# Patient Record
Sex: Male | Born: 1950
Health system: Southern US, Community
[De-identification: ages and names within clinical notes are randomized; demographics above are authoritative.]

## PROBLEM LIST (undated history)

## (undated) DIAGNOSIS — K219 Gastro-esophageal reflux disease without esophagitis: Secondary | ICD-10-CM

## (undated) DIAGNOSIS — I10 Essential (primary) hypertension: Secondary | ICD-10-CM

## (undated) HISTORY — PX: JOINT REPLACEMENT: SHX530

---

## 1991-06-24 HISTORY — PX: CERVICAL FUSION: SHX112

## 2000-09-03 ENCOUNTER — Encounter: Admission: RE | Admit: 2000-09-03 | Discharge: 2000-09-03 | Payer: Self-pay | Admitting: Family Medicine

## 2000-09-03 ENCOUNTER — Encounter: Payer: Self-pay | Admitting: Family Medicine

## 2002-11-04 ENCOUNTER — Ambulatory Visit (HOSPITAL_COMMUNITY): Admission: RE | Admit: 2002-11-04 | Discharge: 2002-11-04 | Payer: Self-pay | Admitting: Gastroenterology

## 2014-04-19 ENCOUNTER — Ambulatory Visit
Admission: RE | Admit: 2014-04-19 | Discharge: 2014-04-19 | Disposition: A | Discharge status: Home / Self Care | Payer: BC Managed Care – PPO | Source: Ambulatory Visit | Attending: Cardiology | Admitting: Cardiology

## 2014-04-19 ENCOUNTER — Other Ambulatory Visit: Payer: Self-pay | Admitting: Cardiology

## 2014-04-19 DIAGNOSIS — R0602 Shortness of breath: Secondary | ICD-10-CM

## 2017-07-02 DIAGNOSIS — G4700 Insomnia, unspecified: Secondary | ICD-10-CM | POA: Diagnosis not present

## 2017-07-02 DIAGNOSIS — Z125 Encounter for screening for malignant neoplasm of prostate: Secondary | ICD-10-CM | POA: Diagnosis not present

## 2017-07-02 DIAGNOSIS — Z0000 Encounter for general adult medical examination without abnormal findings: Secondary | ICD-10-CM | POA: Diagnosis not present

## 2017-07-02 DIAGNOSIS — Z23 Encounter for immunization: Secondary | ICD-10-CM | POA: Diagnosis not present

## 2017-07-02 DIAGNOSIS — R7303 Prediabetes: Secondary | ICD-10-CM | POA: Diagnosis not present

## 2017-07-02 DIAGNOSIS — I10 Essential (primary) hypertension: Secondary | ICD-10-CM | POA: Diagnosis not present

## 2017-07-02 DIAGNOSIS — E785 Hyperlipidemia, unspecified: Secondary | ICD-10-CM | POA: Diagnosis not present

## 2018-03-24 DIAGNOSIS — Z23 Encounter for immunization: Secondary | ICD-10-CM | POA: Diagnosis not present

## 2018-07-06 DIAGNOSIS — R7303 Prediabetes: Secondary | ICD-10-CM | POA: Diagnosis not present

## 2018-07-06 DIAGNOSIS — E785 Hyperlipidemia, unspecified: Secondary | ICD-10-CM | POA: Diagnosis not present

## 2018-07-06 DIAGNOSIS — Z1159 Encounter for screening for other viral diseases: Secondary | ICD-10-CM | POA: Diagnosis not present

## 2018-07-06 DIAGNOSIS — M5416 Radiculopathy, lumbar region: Secondary | ICD-10-CM | POA: Diagnosis not present

## 2018-07-06 DIAGNOSIS — Z23 Encounter for immunization: Secondary | ICD-10-CM | POA: Diagnosis not present

## 2018-07-06 DIAGNOSIS — Z125 Encounter for screening for malignant neoplasm of prostate: Secondary | ICD-10-CM | POA: Diagnosis not present

## 2018-07-06 DIAGNOSIS — Z0000 Encounter for general adult medical examination without abnormal findings: Secondary | ICD-10-CM | POA: Diagnosis not present

## 2018-07-06 DIAGNOSIS — I10 Essential (primary) hypertension: Secondary | ICD-10-CM | POA: Diagnosis not present

## 2018-07-06 DIAGNOSIS — Z86010 Personal history of colonic polyps: Secondary | ICD-10-CM | POA: Diagnosis not present

## 2018-07-06 DIAGNOSIS — G4700 Insomnia, unspecified: Secondary | ICD-10-CM | POA: Diagnosis not present

## 2018-07-07 DIAGNOSIS — R7303 Prediabetes: Secondary | ICD-10-CM | POA: Diagnosis not present

## 2018-07-07 DIAGNOSIS — M5416 Radiculopathy, lumbar region: Secondary | ICD-10-CM | POA: Diagnosis not present

## 2018-07-07 DIAGNOSIS — Z86010 Personal history of colonic polyps: Secondary | ICD-10-CM | POA: Diagnosis not present

## 2018-07-07 DIAGNOSIS — I10 Essential (primary) hypertension: Secondary | ICD-10-CM | POA: Diagnosis not present

## 2018-07-07 DIAGNOSIS — Z125 Encounter for screening for malignant neoplasm of prostate: Secondary | ICD-10-CM | POA: Diagnosis not present

## 2018-07-07 DIAGNOSIS — E785 Hyperlipidemia, unspecified: Secondary | ICD-10-CM | POA: Diagnosis not present

## 2018-07-07 DIAGNOSIS — Z1159 Encounter for screening for other viral diseases: Secondary | ICD-10-CM | POA: Diagnosis not present

## 2018-07-07 DIAGNOSIS — G4700 Insomnia, unspecified: Secondary | ICD-10-CM | POA: Diagnosis not present

## 2018-07-12 DIAGNOSIS — M545 Low back pain: Secondary | ICD-10-CM | POA: Diagnosis not present

## 2018-07-19 DIAGNOSIS — M545 Low back pain: Secondary | ICD-10-CM | POA: Diagnosis not present

## 2018-07-19 DIAGNOSIS — M2560 Stiffness of unspecified joint, not elsewhere classified: Secondary | ICD-10-CM | POA: Diagnosis not present

## 2018-07-19 DIAGNOSIS — M25551 Pain in right hip: Secondary | ICD-10-CM | POA: Diagnosis not present

## 2018-07-19 DIAGNOSIS — M5417 Radiculopathy, lumbosacral region: Secondary | ICD-10-CM | POA: Diagnosis not present

## 2018-07-21 DIAGNOSIS — M2560 Stiffness of unspecified joint, not elsewhere classified: Secondary | ICD-10-CM | POA: Diagnosis not present

## 2018-07-21 DIAGNOSIS — M5417 Radiculopathy, lumbosacral region: Secondary | ICD-10-CM | POA: Diagnosis not present

## 2018-07-21 DIAGNOSIS — M545 Low back pain: Secondary | ICD-10-CM | POA: Diagnosis not present

## 2018-07-21 DIAGNOSIS — M25551 Pain in right hip: Secondary | ICD-10-CM | POA: Diagnosis not present

## 2018-07-23 DIAGNOSIS — M5417 Radiculopathy, lumbosacral region: Secondary | ICD-10-CM | POA: Diagnosis not present

## 2018-07-23 DIAGNOSIS — M545 Low back pain: Secondary | ICD-10-CM | POA: Diagnosis not present

## 2018-07-23 DIAGNOSIS — M2560 Stiffness of unspecified joint, not elsewhere classified: Secondary | ICD-10-CM | POA: Diagnosis not present

## 2018-07-23 DIAGNOSIS — M25551 Pain in right hip: Secondary | ICD-10-CM | POA: Diagnosis not present

## 2018-07-26 DIAGNOSIS — M2560 Stiffness of unspecified joint, not elsewhere classified: Secondary | ICD-10-CM | POA: Diagnosis not present

## 2018-07-26 DIAGNOSIS — M5417 Radiculopathy, lumbosacral region: Secondary | ICD-10-CM | POA: Diagnosis not present

## 2018-07-26 DIAGNOSIS — M545 Low back pain: Secondary | ICD-10-CM | POA: Diagnosis not present

## 2018-07-26 DIAGNOSIS — M25551 Pain in right hip: Secondary | ICD-10-CM | POA: Diagnosis not present

## 2018-07-28 DIAGNOSIS — M5417 Radiculopathy, lumbosacral region: Secondary | ICD-10-CM | POA: Diagnosis not present

## 2018-07-28 DIAGNOSIS — M545 Low back pain: Secondary | ICD-10-CM | POA: Diagnosis not present

## 2018-07-28 DIAGNOSIS — M25551 Pain in right hip: Secondary | ICD-10-CM | POA: Diagnosis not present

## 2018-07-28 DIAGNOSIS — M2560 Stiffness of unspecified joint, not elsewhere classified: Secondary | ICD-10-CM | POA: Diagnosis not present

## 2018-07-30 DIAGNOSIS — M545 Low back pain: Secondary | ICD-10-CM | POA: Diagnosis not present

## 2018-07-30 DIAGNOSIS — M5417 Radiculopathy, lumbosacral region: Secondary | ICD-10-CM | POA: Diagnosis not present

## 2018-07-30 DIAGNOSIS — M25551 Pain in right hip: Secondary | ICD-10-CM | POA: Diagnosis not present

## 2018-07-30 DIAGNOSIS — M2560 Stiffness of unspecified joint, not elsewhere classified: Secondary | ICD-10-CM | POA: Diagnosis not present

## 2018-08-02 DIAGNOSIS — M2560 Stiffness of unspecified joint, not elsewhere classified: Secondary | ICD-10-CM | POA: Diagnosis not present

## 2018-08-02 DIAGNOSIS — M545 Low back pain: Secondary | ICD-10-CM | POA: Diagnosis not present

## 2018-08-02 DIAGNOSIS — M25551 Pain in right hip: Secondary | ICD-10-CM | POA: Diagnosis not present

## 2018-08-02 DIAGNOSIS — M5417 Radiculopathy, lumbosacral region: Secondary | ICD-10-CM | POA: Diagnosis not present

## 2018-08-04 DIAGNOSIS — M2560 Stiffness of unspecified joint, not elsewhere classified: Secondary | ICD-10-CM | POA: Diagnosis not present

## 2018-08-04 DIAGNOSIS — M5417 Radiculopathy, lumbosacral region: Secondary | ICD-10-CM | POA: Diagnosis not present

## 2018-08-04 DIAGNOSIS — M545 Low back pain: Secondary | ICD-10-CM | POA: Diagnosis not present

## 2018-08-04 DIAGNOSIS — M25551 Pain in right hip: Secondary | ICD-10-CM | POA: Diagnosis not present

## 2018-08-06 DIAGNOSIS — M25551 Pain in right hip: Secondary | ICD-10-CM | POA: Diagnosis not present

## 2018-08-06 DIAGNOSIS — M2560 Stiffness of unspecified joint, not elsewhere classified: Secondary | ICD-10-CM | POA: Diagnosis not present

## 2018-08-06 DIAGNOSIS — M5417 Radiculopathy, lumbosacral region: Secondary | ICD-10-CM | POA: Diagnosis not present

## 2018-08-06 DIAGNOSIS — M545 Low back pain: Secondary | ICD-10-CM | POA: Diagnosis not present

## 2018-08-09 DIAGNOSIS — M545 Low back pain: Secondary | ICD-10-CM | POA: Diagnosis not present

## 2018-08-11 DIAGNOSIS — M5417 Radiculopathy, lumbosacral region: Secondary | ICD-10-CM | POA: Diagnosis not present

## 2018-08-11 DIAGNOSIS — M545 Low back pain: Secondary | ICD-10-CM | POA: Diagnosis not present

## 2018-08-11 DIAGNOSIS — M2560 Stiffness of unspecified joint, not elsewhere classified: Secondary | ICD-10-CM | POA: Diagnosis not present

## 2018-08-11 DIAGNOSIS — M25551 Pain in right hip: Secondary | ICD-10-CM | POA: Diagnosis not present

## 2018-08-13 DIAGNOSIS — M5417 Radiculopathy, lumbosacral region: Secondary | ICD-10-CM | POA: Diagnosis not present

## 2018-08-13 DIAGNOSIS — M2560 Stiffness of unspecified joint, not elsewhere classified: Secondary | ICD-10-CM | POA: Diagnosis not present

## 2018-08-13 DIAGNOSIS — M545 Low back pain: Secondary | ICD-10-CM | POA: Diagnosis not present

## 2018-08-13 DIAGNOSIS — M25551 Pain in right hip: Secondary | ICD-10-CM | POA: Diagnosis not present

## 2018-08-17 DIAGNOSIS — M5417 Radiculopathy, lumbosacral region: Secondary | ICD-10-CM | POA: Diagnosis not present

## 2018-08-17 DIAGNOSIS — M25551 Pain in right hip: Secondary | ICD-10-CM | POA: Diagnosis not present

## 2018-08-17 DIAGNOSIS — M545 Low back pain: Secondary | ICD-10-CM | POA: Diagnosis not present

## 2018-08-17 DIAGNOSIS — M2560 Stiffness of unspecified joint, not elsewhere classified: Secondary | ICD-10-CM | POA: Diagnosis not present

## 2018-08-19 DIAGNOSIS — M2560 Stiffness of unspecified joint, not elsewhere classified: Secondary | ICD-10-CM | POA: Diagnosis not present

## 2018-08-19 DIAGNOSIS — M545 Low back pain: Secondary | ICD-10-CM | POA: Diagnosis not present

## 2018-08-19 DIAGNOSIS — M5417 Radiculopathy, lumbosacral region: Secondary | ICD-10-CM | POA: Diagnosis not present

## 2018-08-19 DIAGNOSIS — M25551 Pain in right hip: Secondary | ICD-10-CM | POA: Diagnosis not present

## 2019-03-29 DIAGNOSIS — Z23 Encounter for immunization: Secondary | ICD-10-CM | POA: Diagnosis not present

## 2019-05-25 DIAGNOSIS — M545 Low back pain: Secondary | ICD-10-CM | POA: Diagnosis not present

## 2019-05-25 DIAGNOSIS — M1612 Unilateral primary osteoarthritis, left hip: Secondary | ICD-10-CM | POA: Diagnosis not present

## 2019-06-22 DIAGNOSIS — M25552 Pain in left hip: Secondary | ICD-10-CM | POA: Diagnosis not present

## 2019-06-24 HISTORY — PX: TOTAL HIP ARTHROPLASTY: SHX124

## 2019-07-01 DIAGNOSIS — M1612 Unilateral primary osteoarthritis, left hip: Secondary | ICD-10-CM | POA: Diagnosis not present

## 2019-07-01 DIAGNOSIS — Z01818 Encounter for other preprocedural examination: Secondary | ICD-10-CM | POA: Diagnosis not present

## 2019-07-01 DIAGNOSIS — M25552 Pain in left hip: Secondary | ICD-10-CM | POA: Diagnosis not present

## 2019-07-01 DIAGNOSIS — Z130 Encounter for screening for diseases of the blood and blood-forming organs and certain disorders involving the immune mechanism: Secondary | ICD-10-CM | POA: Diagnosis not present

## 2019-07-01 DIAGNOSIS — R791 Abnormal coagulation profile: Secondary | ICD-10-CM | POA: Diagnosis not present

## 2019-07-06 DIAGNOSIS — D72829 Elevated white blood cell count, unspecified: Secondary | ICD-10-CM | POA: Diagnosis not present

## 2019-07-08 DIAGNOSIS — Z86010 Personal history of colonic polyps: Secondary | ICD-10-CM | POA: Diagnosis not present

## 2019-07-08 DIAGNOSIS — G4700 Insomnia, unspecified: Secondary | ICD-10-CM | POA: Diagnosis not present

## 2019-07-08 DIAGNOSIS — Z125 Encounter for screening for malignant neoplasm of prostate: Secondary | ICD-10-CM | POA: Diagnosis not present

## 2019-07-08 DIAGNOSIS — R7303 Prediabetes: Secondary | ICD-10-CM | POA: Diagnosis not present

## 2019-07-08 DIAGNOSIS — Z0000 Encounter for general adult medical examination without abnormal findings: Secondary | ICD-10-CM | POA: Diagnosis not present

## 2019-07-08 DIAGNOSIS — I10 Essential (primary) hypertension: Secondary | ICD-10-CM | POA: Diagnosis not present

## 2019-07-08 DIAGNOSIS — E785 Hyperlipidemia, unspecified: Secondary | ICD-10-CM | POA: Diagnosis not present

## 2019-07-21 DIAGNOSIS — M1612 Unilateral primary osteoarthritis, left hip: Secondary | ICD-10-CM | POA: Diagnosis not present

## 2019-07-23 DIAGNOSIS — Z471 Aftercare following joint replacement surgery: Secondary | ICD-10-CM | POA: Diagnosis not present

## 2019-07-23 DIAGNOSIS — Z7982 Long term (current) use of aspirin: Secondary | ICD-10-CM | POA: Diagnosis not present

## 2019-07-23 DIAGNOSIS — I10 Essential (primary) hypertension: Secondary | ICD-10-CM | POA: Diagnosis not present

## 2019-07-23 DIAGNOSIS — Z9181 History of falling: Secondary | ICD-10-CM | POA: Diagnosis not present

## 2019-07-23 DIAGNOSIS — Z96642 Presence of left artificial hip joint: Secondary | ICD-10-CM | POA: Diagnosis not present

## 2019-07-25 DIAGNOSIS — Z96642 Presence of left artificial hip joint: Secondary | ICD-10-CM | POA: Diagnosis not present

## 2019-07-25 DIAGNOSIS — Z471 Aftercare following joint replacement surgery: Secondary | ICD-10-CM | POA: Diagnosis not present

## 2019-07-25 DIAGNOSIS — I10 Essential (primary) hypertension: Secondary | ICD-10-CM | POA: Diagnosis not present

## 2019-07-25 DIAGNOSIS — Z9181 History of falling: Secondary | ICD-10-CM | POA: Diagnosis not present

## 2019-07-25 DIAGNOSIS — Z7982 Long term (current) use of aspirin: Secondary | ICD-10-CM | POA: Diagnosis not present

## 2019-07-26 DIAGNOSIS — Z96642 Presence of left artificial hip joint: Secondary | ICD-10-CM | POA: Diagnosis not present

## 2019-07-26 DIAGNOSIS — I10 Essential (primary) hypertension: Secondary | ICD-10-CM | POA: Diagnosis not present

## 2019-07-26 DIAGNOSIS — Z471 Aftercare following joint replacement surgery: Secondary | ICD-10-CM | POA: Diagnosis not present

## 2019-07-26 DIAGNOSIS — Z9181 History of falling: Secondary | ICD-10-CM | POA: Diagnosis not present

## 2019-07-26 DIAGNOSIS — Z7982 Long term (current) use of aspirin: Secondary | ICD-10-CM | POA: Diagnosis not present

## 2019-07-27 DIAGNOSIS — I10 Essential (primary) hypertension: Secondary | ICD-10-CM | POA: Diagnosis not present

## 2019-07-27 DIAGNOSIS — Z96642 Presence of left artificial hip joint: Secondary | ICD-10-CM | POA: Diagnosis not present

## 2019-07-27 DIAGNOSIS — Z471 Aftercare following joint replacement surgery: Secondary | ICD-10-CM | POA: Diagnosis not present

## 2019-07-27 DIAGNOSIS — Z9181 History of falling: Secondary | ICD-10-CM | POA: Diagnosis not present

## 2019-07-27 DIAGNOSIS — Z7982 Long term (current) use of aspirin: Secondary | ICD-10-CM | POA: Diagnosis not present

## 2019-07-28 DIAGNOSIS — M25552 Pain in left hip: Secondary | ICD-10-CM | POA: Diagnosis not present

## 2019-07-28 DIAGNOSIS — M6281 Muscle weakness (generalized): Secondary | ICD-10-CM | POA: Diagnosis not present

## 2019-07-28 DIAGNOSIS — M25452 Effusion, left hip: Secondary | ICD-10-CM | POA: Diagnosis not present

## 2019-07-28 DIAGNOSIS — R262 Difficulty in walking, not elsewhere classified: Secondary | ICD-10-CM | POA: Diagnosis not present

## 2019-07-28 DIAGNOSIS — M79652 Pain in left thigh: Secondary | ICD-10-CM | POA: Diagnosis not present

## 2019-07-28 DIAGNOSIS — Z96642 Presence of left artificial hip joint: Secondary | ICD-10-CM | POA: Diagnosis not present

## 2019-08-02 DIAGNOSIS — M25552 Pain in left hip: Secondary | ICD-10-CM | POA: Diagnosis not present

## 2019-08-02 DIAGNOSIS — R262 Difficulty in walking, not elsewhere classified: Secondary | ICD-10-CM | POA: Diagnosis not present

## 2019-08-02 DIAGNOSIS — M25452 Effusion, left hip: Secondary | ICD-10-CM | POA: Diagnosis not present

## 2019-08-02 DIAGNOSIS — Z96642 Presence of left artificial hip joint: Secondary | ICD-10-CM | POA: Diagnosis not present

## 2019-08-02 DIAGNOSIS — M6281 Muscle weakness (generalized): Secondary | ICD-10-CM | POA: Diagnosis not present

## 2019-08-02 DIAGNOSIS — M79652 Pain in left thigh: Secondary | ICD-10-CM | POA: Diagnosis not present

## 2019-08-03 DIAGNOSIS — M1612 Unilateral primary osteoarthritis, left hip: Secondary | ICD-10-CM | POA: Diagnosis not present

## 2019-08-04 DIAGNOSIS — M25452 Effusion, left hip: Secondary | ICD-10-CM | POA: Diagnosis not present

## 2019-08-04 DIAGNOSIS — M6281 Muscle weakness (generalized): Secondary | ICD-10-CM | POA: Diagnosis not present

## 2019-08-04 DIAGNOSIS — Z96642 Presence of left artificial hip joint: Secondary | ICD-10-CM | POA: Diagnosis not present

## 2019-08-04 DIAGNOSIS — R262 Difficulty in walking, not elsewhere classified: Secondary | ICD-10-CM | POA: Diagnosis not present

## 2019-08-04 DIAGNOSIS — M25552 Pain in left hip: Secondary | ICD-10-CM | POA: Diagnosis not present

## 2019-08-04 DIAGNOSIS — M79652 Pain in left thigh: Secondary | ICD-10-CM | POA: Diagnosis not present

## 2019-08-09 DIAGNOSIS — Z96642 Presence of left artificial hip joint: Secondary | ICD-10-CM | POA: Diagnosis not present

## 2019-08-09 DIAGNOSIS — M25552 Pain in left hip: Secondary | ICD-10-CM | POA: Diagnosis not present

## 2019-08-09 DIAGNOSIS — M6281 Muscle weakness (generalized): Secondary | ICD-10-CM | POA: Diagnosis not present

## 2019-08-09 DIAGNOSIS — M79652 Pain in left thigh: Secondary | ICD-10-CM | POA: Diagnosis not present

## 2019-08-09 DIAGNOSIS — R262 Difficulty in walking, not elsewhere classified: Secondary | ICD-10-CM | POA: Diagnosis not present

## 2019-08-09 DIAGNOSIS — M25452 Effusion, left hip: Secondary | ICD-10-CM | POA: Diagnosis not present

## 2019-08-12 DIAGNOSIS — R262 Difficulty in walking, not elsewhere classified: Secondary | ICD-10-CM | POA: Diagnosis not present

## 2019-08-12 DIAGNOSIS — M6281 Muscle weakness (generalized): Secondary | ICD-10-CM | POA: Diagnosis not present

## 2019-08-12 DIAGNOSIS — Z96642 Presence of left artificial hip joint: Secondary | ICD-10-CM | POA: Diagnosis not present

## 2019-08-12 DIAGNOSIS — M79652 Pain in left thigh: Secondary | ICD-10-CM | POA: Diagnosis not present

## 2019-08-12 DIAGNOSIS — M25552 Pain in left hip: Secondary | ICD-10-CM | POA: Diagnosis not present

## 2019-08-12 DIAGNOSIS — M25452 Effusion, left hip: Secondary | ICD-10-CM | POA: Diagnosis not present

## 2019-08-16 DIAGNOSIS — M79652 Pain in left thigh: Secondary | ICD-10-CM | POA: Diagnosis not present

## 2019-08-16 DIAGNOSIS — M6281 Muscle weakness (generalized): Secondary | ICD-10-CM | POA: Diagnosis not present

## 2019-08-16 DIAGNOSIS — M25452 Effusion, left hip: Secondary | ICD-10-CM | POA: Diagnosis not present

## 2019-08-16 DIAGNOSIS — R262 Difficulty in walking, not elsewhere classified: Secondary | ICD-10-CM | POA: Diagnosis not present

## 2019-08-16 DIAGNOSIS — Z96642 Presence of left artificial hip joint: Secondary | ICD-10-CM | POA: Diagnosis not present

## 2019-08-16 DIAGNOSIS — M25552 Pain in left hip: Secondary | ICD-10-CM | POA: Diagnosis not present

## 2019-08-18 DIAGNOSIS — M25452 Effusion, left hip: Secondary | ICD-10-CM | POA: Diagnosis not present

## 2019-08-18 DIAGNOSIS — M6281 Muscle weakness (generalized): Secondary | ICD-10-CM | POA: Diagnosis not present

## 2019-08-18 DIAGNOSIS — Z96642 Presence of left artificial hip joint: Secondary | ICD-10-CM | POA: Diagnosis not present

## 2019-08-18 DIAGNOSIS — M79652 Pain in left thigh: Secondary | ICD-10-CM | POA: Diagnosis not present

## 2019-08-18 DIAGNOSIS — M25552 Pain in left hip: Secondary | ICD-10-CM | POA: Diagnosis not present

## 2019-08-18 DIAGNOSIS — R262 Difficulty in walking, not elsewhere classified: Secondary | ICD-10-CM | POA: Diagnosis not present

## 2019-08-22 ENCOUNTER — Ambulatory Visit: Payer: Medicare Other | Attending: Internal Medicine

## 2019-08-22 DIAGNOSIS — Z23 Encounter for immunization: Secondary | ICD-10-CM

## 2019-08-22 DIAGNOSIS — Z471 Aftercare following joint replacement surgery: Secondary | ICD-10-CM | POA: Diagnosis not present

## 2019-08-22 NOTE — Progress Notes (Signed)
   Covid-19 Vaccination Clinic  Name:  Colton Norris    MRN: [id number] DOB: 25-Sep-1950  08/22/2019  [name] was observed post Covid-19 immunization for 15 minutes without incidence. He was provided with Vaccine Information Sheet and instruction to access the V-Safe system.   [name] was instructed to call 911 with any severe reactions post vaccine: Mar[place] Kitchen Difficulty breathing  . Swelling of your face and throat  . A fast heartbeat  . A bad rash all over your body  . Dizziness and weakness    Immunizations Administered    Name Date Dose VIS Date Route   Pfizer COVID-19 Vaccine 08/22/2019 10:31 AM 0.3 mL 06/03/2019 Intramuscular   Manufacturer: [place]   Lot: [id number]   [place]: [id number]

## 2019-08-23 DIAGNOSIS — M25452 Effusion, left hip: Secondary | ICD-10-CM | POA: Diagnosis not present

## 2019-08-23 DIAGNOSIS — M6281 Muscle weakness (generalized): Secondary | ICD-10-CM | POA: Diagnosis not present

## 2019-08-23 DIAGNOSIS — Z96642 Presence of left artificial hip joint: Secondary | ICD-10-CM | POA: Diagnosis not present

## 2019-08-23 DIAGNOSIS — M25552 Pain in left hip: Secondary | ICD-10-CM | POA: Diagnosis not present

## 2019-08-23 DIAGNOSIS — R262 Difficulty in walking, not elsewhere classified: Secondary | ICD-10-CM | POA: Diagnosis not present

## 2019-08-23 DIAGNOSIS — M79652 Pain in left thigh: Secondary | ICD-10-CM | POA: Diagnosis not present

## 2019-08-25 DIAGNOSIS — R7303 Prediabetes: Secondary | ICD-10-CM | POA: Diagnosis not present

## 2019-08-25 DIAGNOSIS — M25552 Pain in left hip: Secondary | ICD-10-CM | POA: Diagnosis not present

## 2019-08-25 DIAGNOSIS — Z125 Encounter for screening for malignant neoplasm of prostate: Secondary | ICD-10-CM | POA: Diagnosis not present

## 2019-08-25 DIAGNOSIS — Z0000 Encounter for general adult medical examination without abnormal findings: Secondary | ICD-10-CM | POA: Diagnosis not present

## 2019-08-25 DIAGNOSIS — M6281 Muscle weakness (generalized): Secondary | ICD-10-CM | POA: Diagnosis not present

## 2019-08-25 DIAGNOSIS — R262 Difficulty in walking, not elsewhere classified: Secondary | ICD-10-CM | POA: Diagnosis not present

## 2019-08-25 DIAGNOSIS — G4700 Insomnia, unspecified: Secondary | ICD-10-CM | POA: Diagnosis not present

## 2019-08-25 DIAGNOSIS — E785 Hyperlipidemia, unspecified: Secondary | ICD-10-CM | POA: Diagnosis not present

## 2019-08-25 DIAGNOSIS — I10 Essential (primary) hypertension: Secondary | ICD-10-CM | POA: Diagnosis not present

## 2019-08-25 DIAGNOSIS — Z96642 Presence of left artificial hip joint: Secondary | ICD-10-CM | POA: Diagnosis not present

## 2019-08-25 DIAGNOSIS — Z86010 Personal history of colonic polyps: Secondary | ICD-10-CM | POA: Diagnosis not present

## 2019-08-25 DIAGNOSIS — M25452 Effusion, left hip: Secondary | ICD-10-CM | POA: Diagnosis not present

## 2019-08-25 DIAGNOSIS — M79652 Pain in left thigh: Secondary | ICD-10-CM | POA: Diagnosis not present

## 2019-08-30 DIAGNOSIS — M25552 Pain in left hip: Secondary | ICD-10-CM | POA: Diagnosis not present

## 2019-08-30 DIAGNOSIS — M79652 Pain in left thigh: Secondary | ICD-10-CM | POA: Diagnosis not present

## 2019-08-30 DIAGNOSIS — Z96642 Presence of left artificial hip joint: Secondary | ICD-10-CM | POA: Diagnosis not present

## 2019-08-30 DIAGNOSIS — M25452 Effusion, left hip: Secondary | ICD-10-CM | POA: Diagnosis not present

## 2019-08-30 DIAGNOSIS — R262 Difficulty in walking, not elsewhere classified: Secondary | ICD-10-CM | POA: Diagnosis not present

## 2019-08-30 DIAGNOSIS — M6281 Muscle weakness (generalized): Secondary | ICD-10-CM | POA: Diagnosis not present

## 2019-08-31 DIAGNOSIS — M1612 Unilateral primary osteoarthritis, left hip: Secondary | ICD-10-CM | POA: Diagnosis not present

## 2019-09-01 DIAGNOSIS — M25452 Effusion, left hip: Secondary | ICD-10-CM | POA: Diagnosis not present

## 2019-09-01 DIAGNOSIS — M79652 Pain in left thigh: Secondary | ICD-10-CM | POA: Diagnosis not present

## 2019-09-01 DIAGNOSIS — M25552 Pain in left hip: Secondary | ICD-10-CM | POA: Diagnosis not present

## 2019-09-01 DIAGNOSIS — M6281 Muscle weakness (generalized): Secondary | ICD-10-CM | POA: Diagnosis not present

## 2019-09-01 DIAGNOSIS — Z96642 Presence of left artificial hip joint: Secondary | ICD-10-CM | POA: Diagnosis not present

## 2019-09-01 DIAGNOSIS — R262 Difficulty in walking, not elsewhere classified: Secondary | ICD-10-CM | POA: Diagnosis not present

## 2019-09-07 DIAGNOSIS — M6281 Muscle weakness (generalized): Secondary | ICD-10-CM | POA: Diagnosis not present

## 2019-09-07 DIAGNOSIS — Z96642 Presence of left artificial hip joint: Secondary | ICD-10-CM | POA: Diagnosis not present

## 2019-09-07 DIAGNOSIS — M25552 Pain in left hip: Secondary | ICD-10-CM | POA: Diagnosis not present

## 2019-09-07 DIAGNOSIS — R262 Difficulty in walking, not elsewhere classified: Secondary | ICD-10-CM | POA: Diagnosis not present

## 2019-09-07 DIAGNOSIS — M79652 Pain in left thigh: Secondary | ICD-10-CM | POA: Diagnosis not present

## 2019-09-07 DIAGNOSIS — M25452 Effusion, left hip: Secondary | ICD-10-CM | POA: Diagnosis not present

## 2019-09-20 ENCOUNTER — Ambulatory Visit: Payer: Medicare Other | Attending: Internal Medicine

## 2019-09-20 DIAGNOSIS — Z23 Encounter for immunization: Secondary | ICD-10-CM

## 2019-09-20 NOTE — Progress Notes (Signed)
   Covid-19 Vaccination Clinic  Name:  Colton Norris    MRN: [id number] DOB: [birth year]  09/20/2019  Mr. Colton Norris was observed post Covid-19 immunization for 15 minutes without incident. He was provided with Vaccine Information Sheet and instruction to access the V-Safe system.   Colton Norris was instructed to call 911 with any severe reactions post vaccine: Marland Kitchen Difficulty breathing  . Swelling of face and throat  . A fast heartbeat  . A bad rash all over body  . Dizziness and weakness   Immunizations Administered    Name Date Dose VIS Date Route   Pfizer COVID-19 Vaccine 09/20/2019 10:58 AM 0.3 mL 06/03/2019 Intramuscular   Manufacturer: [place]   Lot: [id number]   [place]: [id number]

## 2019-09-28 DIAGNOSIS — Z96642 Presence of left artificial hip joint: Secondary | ICD-10-CM | POA: Diagnosis not present

## 2019-12-22 HISTORY — PX: TOTAL HIP ARTHROPLASTY: SHX124

## 2019-12-24 DIAGNOSIS — M25551 Pain in right hip: Secondary | ICD-10-CM | POA: Diagnosis not present

## 2019-12-28 DIAGNOSIS — M25551 Pain in right hip: Secondary | ICD-10-CM | POA: Diagnosis not present

## 2019-12-28 DIAGNOSIS — R791 Abnormal coagulation profile: Secondary | ICD-10-CM | POA: Diagnosis not present

## 2019-12-28 DIAGNOSIS — M1611 Unilateral primary osteoarthritis, right hip: Secondary | ICD-10-CM | POA: Diagnosis not present

## 2019-12-28 DIAGNOSIS — Z01812 Encounter for preprocedural laboratory examination: Secondary | ICD-10-CM | POA: Diagnosis not present

## 2020-01-05 DIAGNOSIS — M1611 Unilateral primary osteoarthritis, right hip: Secondary | ICD-10-CM | POA: Diagnosis not present

## 2020-01-09 DIAGNOSIS — M6281 Muscle weakness (generalized): Secondary | ICD-10-CM | POA: Diagnosis not present

## 2020-01-09 DIAGNOSIS — M25551 Pain in right hip: Secondary | ICD-10-CM | POA: Diagnosis not present

## 2020-01-09 DIAGNOSIS — M25651 Stiffness of right hip, not elsewhere classified: Secondary | ICD-10-CM | POA: Diagnosis not present

## 2020-01-09 DIAGNOSIS — R262 Difficulty in walking, not elsewhere classified: Secondary | ICD-10-CM | POA: Diagnosis not present

## 2020-01-09 DIAGNOSIS — Z96641 Presence of right artificial hip joint: Secondary | ICD-10-CM | POA: Diagnosis not present

## 2020-01-11 DIAGNOSIS — Z96641 Presence of right artificial hip joint: Secondary | ICD-10-CM | POA: Diagnosis not present

## 2020-01-11 DIAGNOSIS — M6281 Muscle weakness (generalized): Secondary | ICD-10-CM | POA: Diagnosis not present

## 2020-01-11 DIAGNOSIS — M25551 Pain in right hip: Secondary | ICD-10-CM | POA: Diagnosis not present

## 2020-01-11 DIAGNOSIS — R262 Difficulty in walking, not elsewhere classified: Secondary | ICD-10-CM | POA: Diagnosis not present

## 2020-01-11 DIAGNOSIS — M25651 Stiffness of right hip, not elsewhere classified: Secondary | ICD-10-CM | POA: Diagnosis not present

## 2020-01-16 DIAGNOSIS — M25651 Stiffness of right hip, not elsewhere classified: Secondary | ICD-10-CM | POA: Diagnosis not present

## 2020-01-16 DIAGNOSIS — M25551 Pain in right hip: Secondary | ICD-10-CM | POA: Diagnosis not present

## 2020-01-16 DIAGNOSIS — Z96641 Presence of right artificial hip joint: Secondary | ICD-10-CM | POA: Diagnosis not present

## 2020-01-16 DIAGNOSIS — M6281 Muscle weakness (generalized): Secondary | ICD-10-CM | POA: Diagnosis not present

## 2020-01-16 DIAGNOSIS — R262 Difficulty in walking, not elsewhere classified: Secondary | ICD-10-CM | POA: Diagnosis not present

## 2020-01-18 DIAGNOSIS — M1611 Unilateral primary osteoarthritis, right hip: Secondary | ICD-10-CM | POA: Diagnosis not present

## 2020-01-19 DIAGNOSIS — Z96641 Presence of right artificial hip joint: Secondary | ICD-10-CM | POA: Diagnosis not present

## 2020-01-19 DIAGNOSIS — M25551 Pain in right hip: Secondary | ICD-10-CM | POA: Diagnosis not present

## 2020-01-19 DIAGNOSIS — M25651 Stiffness of right hip, not elsewhere classified: Secondary | ICD-10-CM | POA: Diagnosis not present

## 2020-01-19 DIAGNOSIS — R262 Difficulty in walking, not elsewhere classified: Secondary | ICD-10-CM | POA: Diagnosis not present

## 2020-01-19 DIAGNOSIS — M6281 Muscle weakness (generalized): Secondary | ICD-10-CM | POA: Diagnosis not present

## 2020-01-23 DIAGNOSIS — M25551 Pain in right hip: Secondary | ICD-10-CM | POA: Diagnosis not present

## 2020-01-23 DIAGNOSIS — Z96641 Presence of right artificial hip joint: Secondary | ICD-10-CM | POA: Diagnosis not present

## 2020-01-23 DIAGNOSIS — M6281 Muscle weakness (generalized): Secondary | ICD-10-CM | POA: Diagnosis not present

## 2020-01-23 DIAGNOSIS — R262 Difficulty in walking, not elsewhere classified: Secondary | ICD-10-CM | POA: Diagnosis not present

## 2020-01-23 DIAGNOSIS — M25651 Stiffness of right hip, not elsewhere classified: Secondary | ICD-10-CM | POA: Diagnosis not present

## 2020-01-25 DIAGNOSIS — M25651 Stiffness of right hip, not elsewhere classified: Secondary | ICD-10-CM | POA: Diagnosis not present

## 2020-01-25 DIAGNOSIS — M6281 Muscle weakness (generalized): Secondary | ICD-10-CM | POA: Diagnosis not present

## 2020-01-25 DIAGNOSIS — M25551 Pain in right hip: Secondary | ICD-10-CM | POA: Diagnosis not present

## 2020-01-25 DIAGNOSIS — Z96641 Presence of right artificial hip joint: Secondary | ICD-10-CM | POA: Diagnosis not present

## 2020-01-25 DIAGNOSIS — R262 Difficulty in walking, not elsewhere classified: Secondary | ICD-10-CM | POA: Diagnosis not present

## 2020-01-30 DIAGNOSIS — R262 Difficulty in walking, not elsewhere classified: Secondary | ICD-10-CM | POA: Diagnosis not present

## 2020-01-30 DIAGNOSIS — M6281 Muscle weakness (generalized): Secondary | ICD-10-CM | POA: Diagnosis not present

## 2020-01-30 DIAGNOSIS — M25551 Pain in right hip: Secondary | ICD-10-CM | POA: Diagnosis not present

## 2020-01-30 DIAGNOSIS — Z96641 Presence of right artificial hip joint: Secondary | ICD-10-CM | POA: Diagnosis not present

## 2020-01-30 DIAGNOSIS — M25651 Stiffness of right hip, not elsewhere classified: Secondary | ICD-10-CM | POA: Diagnosis not present

## 2020-02-03 DIAGNOSIS — Z96641 Presence of right artificial hip joint: Secondary | ICD-10-CM | POA: Diagnosis not present

## 2020-02-03 DIAGNOSIS — M25651 Stiffness of right hip, not elsewhere classified: Secondary | ICD-10-CM | POA: Diagnosis not present

## 2020-02-03 DIAGNOSIS — M6281 Muscle weakness (generalized): Secondary | ICD-10-CM | POA: Diagnosis not present

## 2020-02-03 DIAGNOSIS — R262 Difficulty in walking, not elsewhere classified: Secondary | ICD-10-CM | POA: Diagnosis not present

## 2020-02-03 DIAGNOSIS — M25551 Pain in right hip: Secondary | ICD-10-CM | POA: Diagnosis not present

## 2020-02-06 DIAGNOSIS — M25551 Pain in right hip: Secondary | ICD-10-CM | POA: Diagnosis not present

## 2020-02-06 DIAGNOSIS — M25651 Stiffness of right hip, not elsewhere classified: Secondary | ICD-10-CM | POA: Diagnosis not present

## 2020-02-06 DIAGNOSIS — Z96641 Presence of right artificial hip joint: Secondary | ICD-10-CM | POA: Diagnosis not present

## 2020-02-06 DIAGNOSIS — R262 Difficulty in walking, not elsewhere classified: Secondary | ICD-10-CM | POA: Diagnosis not present

## 2020-02-06 DIAGNOSIS — M6281 Muscle weakness (generalized): Secondary | ICD-10-CM | POA: Diagnosis not present

## 2020-02-08 DIAGNOSIS — M25651 Stiffness of right hip, not elsewhere classified: Secondary | ICD-10-CM | POA: Diagnosis not present

## 2020-02-08 DIAGNOSIS — R262 Difficulty in walking, not elsewhere classified: Secondary | ICD-10-CM | POA: Diagnosis not present

## 2020-02-08 DIAGNOSIS — M25551 Pain in right hip: Secondary | ICD-10-CM | POA: Diagnosis not present

## 2020-02-08 DIAGNOSIS — M6281 Muscle weakness (generalized): Secondary | ICD-10-CM | POA: Diagnosis not present

## 2020-02-08 DIAGNOSIS — Z96641 Presence of right artificial hip joint: Secondary | ICD-10-CM | POA: Diagnosis not present

## 2020-02-13 DIAGNOSIS — M25551 Pain in right hip: Secondary | ICD-10-CM | POA: Diagnosis not present

## 2020-02-13 DIAGNOSIS — Z96641 Presence of right artificial hip joint: Secondary | ICD-10-CM | POA: Diagnosis not present

## 2020-02-13 DIAGNOSIS — M25651 Stiffness of right hip, not elsewhere classified: Secondary | ICD-10-CM | POA: Diagnosis not present

## 2020-02-13 DIAGNOSIS — M6281 Muscle weakness (generalized): Secondary | ICD-10-CM | POA: Diagnosis not present

## 2020-02-13 DIAGNOSIS — R262 Difficulty in walking, not elsewhere classified: Secondary | ICD-10-CM | POA: Diagnosis not present

## 2020-02-20 DIAGNOSIS — M25551 Pain in right hip: Secondary | ICD-10-CM | POA: Diagnosis not present

## 2020-02-20 DIAGNOSIS — M25651 Stiffness of right hip, not elsewhere classified: Secondary | ICD-10-CM | POA: Diagnosis not present

## 2020-02-20 DIAGNOSIS — R262 Difficulty in walking, not elsewhere classified: Secondary | ICD-10-CM | POA: Diagnosis not present

## 2020-02-20 DIAGNOSIS — Z96641 Presence of right artificial hip joint: Secondary | ICD-10-CM | POA: Diagnosis not present

## 2020-02-20 DIAGNOSIS — M6281 Muscle weakness (generalized): Secondary | ICD-10-CM | POA: Diagnosis not present

## 2020-02-22 DIAGNOSIS — R262 Difficulty in walking, not elsewhere classified: Secondary | ICD-10-CM | POA: Diagnosis not present

## 2020-02-22 DIAGNOSIS — M6281 Muscle weakness (generalized): Secondary | ICD-10-CM | POA: Diagnosis not present

## 2020-02-22 DIAGNOSIS — M25551 Pain in right hip: Secondary | ICD-10-CM | POA: Diagnosis not present

## 2020-02-22 DIAGNOSIS — Z96641 Presence of right artificial hip joint: Secondary | ICD-10-CM | POA: Diagnosis not present

## 2020-02-22 DIAGNOSIS — M25651 Stiffness of right hip, not elsewhere classified: Secondary | ICD-10-CM | POA: Diagnosis not present

## 2020-03-01 DIAGNOSIS — R7303 Prediabetes: Secondary | ICD-10-CM | POA: Diagnosis not present

## 2020-03-01 DIAGNOSIS — I10 Essential (primary) hypertension: Secondary | ICD-10-CM | POA: Diagnosis not present

## 2020-03-01 DIAGNOSIS — G4700 Insomnia, unspecified: Secondary | ICD-10-CM | POA: Diagnosis not present

## 2020-03-01 DIAGNOSIS — Z86010 Personal history of colonic polyps: Secondary | ICD-10-CM | POA: Diagnosis not present

## 2020-03-01 DIAGNOSIS — E785 Hyperlipidemia, unspecified: Secondary | ICD-10-CM | POA: Diagnosis not present

## 2020-03-06 DIAGNOSIS — Z86010 Personal history of colonic polyps: Secondary | ICD-10-CM | POA: Diagnosis not present

## 2020-03-06 DIAGNOSIS — I10 Essential (primary) hypertension: Secondary | ICD-10-CM | POA: Diagnosis not present

## 2020-03-06 DIAGNOSIS — G4700 Insomnia, unspecified: Secondary | ICD-10-CM | POA: Diagnosis not present

## 2020-03-06 DIAGNOSIS — E785 Hyperlipidemia, unspecified: Secondary | ICD-10-CM | POA: Diagnosis not present

## 2020-03-06 DIAGNOSIS — R7303 Prediabetes: Secondary | ICD-10-CM | POA: Diagnosis not present

## 2020-04-04 DIAGNOSIS — M1611 Unilateral primary osteoarthritis, right hip: Secondary | ICD-10-CM | POA: Diagnosis not present

## 2020-04-12 DIAGNOSIS — Z23 Encounter for immunization: Secondary | ICD-10-CM | POA: Diagnosis not present

## 2020-08-27 DIAGNOSIS — R7303 Prediabetes: Secondary | ICD-10-CM | POA: Diagnosis not present

## 2020-08-27 DIAGNOSIS — I10 Essential (primary) hypertension: Secondary | ICD-10-CM | POA: Diagnosis not present

## 2020-08-27 DIAGNOSIS — Z1211 Encounter for screening for malignant neoplasm of colon: Secondary | ICD-10-CM | POA: Diagnosis not present

## 2020-08-27 DIAGNOSIS — Z125 Encounter for screening for malignant neoplasm of prostate: Secondary | ICD-10-CM | POA: Diagnosis not present

## 2020-08-27 DIAGNOSIS — Z0000 Encounter for general adult medical examination without abnormal findings: Secondary | ICD-10-CM | POA: Diagnosis not present

## 2020-08-27 DIAGNOSIS — Z86010 Personal history of colonic polyps: Secondary | ICD-10-CM | POA: Diagnosis not present

## 2020-08-27 DIAGNOSIS — E785 Hyperlipidemia, unspecified: Secondary | ICD-10-CM | POA: Diagnosis not present

## 2020-08-27 DIAGNOSIS — G4700 Insomnia, unspecified: Secondary | ICD-10-CM | POA: Diagnosis not present

## 2020-08-28 DIAGNOSIS — Z23 Encounter for immunization: Secondary | ICD-10-CM | POA: Diagnosis not present

## 2020-11-05 DIAGNOSIS — M5432 Sciatica, left side: Secondary | ICD-10-CM | POA: Diagnosis not present

## 2020-12-03 DIAGNOSIS — M5432 Sciatica, left side: Secondary | ICD-10-CM | POA: Diagnosis not present

## 2020-12-11 DIAGNOSIS — M545 Low back pain, unspecified: Secondary | ICD-10-CM | POA: Diagnosis not present

## 2020-12-11 DIAGNOSIS — M5416 Radiculopathy, lumbar region: Secondary | ICD-10-CM | POA: Diagnosis not present

## 2020-12-17 DIAGNOSIS — M5126 Other intervertebral disc displacement, lumbar region: Secondary | ICD-10-CM | POA: Diagnosis not present

## 2020-12-17 DIAGNOSIS — M5416 Radiculopathy, lumbar region: Secondary | ICD-10-CM | POA: Diagnosis not present

## 2020-12-17 DIAGNOSIS — M48061 Spinal stenosis, lumbar region without neurogenic claudication: Secondary | ICD-10-CM | POA: Diagnosis not present

## 2020-12-19 DIAGNOSIS — M5126 Other intervertebral disc displacement, lumbar region: Secondary | ICD-10-CM | POA: Diagnosis not present

## 2020-12-19 DIAGNOSIS — M48061 Spinal stenosis, lumbar region without neurogenic claudication: Secondary | ICD-10-CM | POA: Diagnosis not present

## 2020-12-19 DIAGNOSIS — M5416 Radiculopathy, lumbar region: Secondary | ICD-10-CM | POA: Diagnosis not present

## 2021-01-14 DIAGNOSIS — M5416 Radiculopathy, lumbar region: Secondary | ICD-10-CM | POA: Diagnosis not present

## 2021-02-05 DIAGNOSIS — M48061 Spinal stenosis, lumbar region without neurogenic claudication: Secondary | ICD-10-CM | POA: Diagnosis not present

## 2021-02-05 DIAGNOSIS — M5416 Radiculopathy, lumbar region: Secondary | ICD-10-CM | POA: Diagnosis not present

## 2021-02-11 DIAGNOSIS — M545 Low back pain, unspecified: Secondary | ICD-10-CM | POA: Diagnosis not present

## 2021-02-15 DIAGNOSIS — M545 Low back pain, unspecified: Secondary | ICD-10-CM | POA: Diagnosis not present

## 2021-02-19 DIAGNOSIS — M545 Low back pain, unspecified: Secondary | ICD-10-CM | POA: Diagnosis not present

## 2021-02-22 DIAGNOSIS — M1711 Unilateral primary osteoarthritis, right knee: Secondary | ICD-10-CM | POA: Diagnosis not present

## 2021-02-22 DIAGNOSIS — M25661 Stiffness of right knee, not elsewhere classified: Secondary | ICD-10-CM | POA: Diagnosis not present

## 2021-02-22 DIAGNOSIS — M6281 Muscle weakness (generalized): Secondary | ICD-10-CM | POA: Diagnosis not present

## 2021-02-22 DIAGNOSIS — M25561 Pain in right knee: Secondary | ICD-10-CM | POA: Diagnosis not present

## 2021-02-26 DIAGNOSIS — M545 Low back pain, unspecified: Secondary | ICD-10-CM | POA: Diagnosis not present

## 2021-02-28 DIAGNOSIS — M545 Low back pain, unspecified: Secondary | ICD-10-CM | POA: Diagnosis not present

## 2021-03-04 DIAGNOSIS — Z23 Encounter for immunization: Secondary | ICD-10-CM | POA: Diagnosis not present

## 2021-03-04 DIAGNOSIS — G4700 Insomnia, unspecified: Secondary | ICD-10-CM | POA: Diagnosis not present

## 2021-03-04 DIAGNOSIS — E785 Hyperlipidemia, unspecified: Secondary | ICD-10-CM | POA: Diagnosis not present

## 2021-03-04 DIAGNOSIS — R7303 Prediabetes: Secondary | ICD-10-CM | POA: Diagnosis not present

## 2021-03-04 DIAGNOSIS — I10 Essential (primary) hypertension: Secondary | ICD-10-CM | POA: Diagnosis not present

## 2021-03-13 DIAGNOSIS — M5416 Radiculopathy, lumbar region: Secondary | ICD-10-CM | POA: Diagnosis not present

## 2021-03-19 DIAGNOSIS — M5416 Radiculopathy, lumbar region: Secondary | ICD-10-CM | POA: Diagnosis not present

## 2021-03-19 DIAGNOSIS — M48061 Spinal stenosis, lumbar region without neurogenic claudication: Secondary | ICD-10-CM | POA: Diagnosis not present

## 2021-03-19 DIAGNOSIS — M545 Low back pain, unspecified: Secondary | ICD-10-CM | POA: Diagnosis not present

## 2021-03-26 ENCOUNTER — Other Ambulatory Visit: Payer: Self-pay | Admitting: Orthopedic Surgery

## 2021-04-08 NOTE — Pre-Procedure Instructions (Signed)
Surgical Instructions    Your procedure is scheduled on Thursday, October 27th.  Report to [facility] Main Entrance "A" at 5:30 A.M., then check in with the Admitting office.  Call this number if you have problems the morning of surgery:  [phone]   If you have any questions prior to your surgery date call [phone]: Open Monday-Friday 8am-4pm    Remember:  Do not eat after midnight the night before your surgery  You may drink clear liquids until 4:30 a.m. the morning of your surgery.   Clear liquids allowed are: Water, Non-Citrus Juices (without pulp), Carbonated Beverages, Clear Tea, Black Coffee Only, and Gatorade.   Enhanced Recovery after Surgery for Orthopedics Enhanced Recovery after Surgery is a protocol used to improve the stress on your body and your recovery after surgery.  Patient Instructions  The day of surgery (if you do NOT have diabetes):  Drink ONE (1) Pre-Surgery Clear Ensure by 4:30 am the morning of surgery   This drink was given to you during your hospital  pre-op appointment visit. Nothing else to drink after completing the  Pre-Surgery Clear Ensure.         If you have questions, please contact your surgeon's office.     Take these medicines the morning of surgery with A SIP OF WATER  gabapentin (NEURONTIN)  omeprazole (PRILOSEC) simvastatin (ZOCOR)  Follow your surgeon's instructions on when to stop Aspirin.  If no instructions were given by your surgeon then you will need to call the office to get those instructions.    As of today, STOP taking any Aspirin (unless otherwise instructed by your surgeon) Aleve, Naproxen, Ibuprofen, Motrin, Advil, Goody's, BC's, all herbal medications, fish oil, and all vitamins.                     Do NOT Smoke (Tobacco/Vaping) or drink Alcohol 24 hours prior to your procedure.  If you use a CPAP at night, you may bring all equipment for your overnight stay.   Contacts, glasses, piercing's, hearing aid's,  dentures or partials may not be worn into surgery, please bring cases for these belongings.    For patients admitted to the hospital, discharge time will be determined by your treatment team.   Patients discharged the day of surgery will not be allowed to drive home, and someone needs to stay with them for 24 hours.  NO VISITORS WILL BE ALLOWED IN PRE-OP WHERE PATIENTS GET READY FOR SURGERY.  ONLY 1 SUPPORT PERSON MAY BE PRESENT IN THE WAITING ROOM WHILE YOU ARE IN SURGERY.  IF YOU ARE TO BE ADMITTED, ONCE YOU ARE IN YOUR ROOM YOU WILL BE ALLOWED TWO (2) VISITORS.  Minor children may have two parents present. Special consideration for safety and communication needs will be reviewed on a case by case basis.   Special instructions:   [place]- Preparing For Surgery  Before surgery, you can play an important role. Because skin is not sterile, your skin needs to be as free of germs as possible. You can reduce the number of germs on your skin by washing with CHG (chlorahexidine gluconate) Soap before surgery.  CHG is an antiseptic cleaner which kills germs and bonds with the skin to continue killing germs even after washing.    Oral Hygiene is also important to reduce your risk of infection.  Remember - BRUSH YOUR TEETH THE MORNING OF SURGERY WITH YOUR REGULAR TOOTHPASTE  Please do not use if you have an  allergy to CHG or antibacterial soaps. If your skin becomes reddened/irritated stop using the CHG.  Do not shave (including legs and underarms) for at least 48 hours prior to first CHG shower. It is OK to shave your face.  Please follow these instructions carefully.   Shower the NIGHT BEFORE SURGERY and the MORNING OF SURGERY  If you chose to wash your hair, wash your hair first as usual with your normal shampoo.  After you shampoo, rinse your hair and body thoroughly to remove the shampoo.  Use CHG Soap as you would any other liquid soap. You can apply CHG directly to the skin and wash  gently with a scrungie or a clean washcloth.   Apply the CHG Soap to your body ONLY FROM THE NECK DOWN.  Do not use on open wounds or open sores. Avoid contact with your eyes, ears, mouth and genitals (private parts). Wash Face and genitals (private parts)  with your normal soap.   Wash thoroughly, paying special attention to the area where your surgery will be performed.  Thoroughly rinse your body with warm water from the neck down.  DO NOT shower/wash with your normal soap after using and rinsing off the CHG Soap.  Pat yourself dry with a CLEAN TOWEL.  Wear CLEAN PAJAMAS to bed the night before surgery  Place CLEAN SHEETS on your bed the night before your surgery  DO NOT SLEEP WITH PETS.   Day of Surgery: Shower with CHG soap. Do not wear jewelry. Do not wear lotions, powders, colognes, or deodorant. Do not shave 48 hours prior to surgery.  Men may shave face and neck. Do not bring valuables to the hospital. [facility] is not responsible for any belongings or valuables. Wear Clean/Comfortable clothing the morning of surgery Remember to brush your teeth WITH YOUR REGULAR TOOTHPASTE.   Please read over the following fact sheets that you were given.   3 days prior to your procedure or After your COVID test   You are not required to quarantine however you are required to wear a well-fitting mask when you are out and around people not in your household. If your mask becomes wet or soiled, replace with a new one.   Wash your hands often with soap and water for 20 seconds or clean your hands with an alcohol-based hand sanitizer that contains at least 60% alcohol.   Do not share personal items.   Notify your provider:  o if you are in close contact with someone who has COVID  o or if you develop a fever of 100.4 or greater, sneezing, cough, sore throat, shortness of breath or body aches.

## 2021-04-09 ENCOUNTER — Encounter (HOSPITAL_COMMUNITY): Payer: Self-pay

## 2021-04-09 ENCOUNTER — Other Ambulatory Visit: Payer: Self-pay

## 2021-04-09 ENCOUNTER — Encounter (HOSPITAL_COMMUNITY)
Admission: RE | Admit: 2021-04-09 | Discharge: 2021-04-09 | Disposition: A | Discharge status: Home / Self Care | Payer: Medicare Other | Source: Ambulatory Visit | Attending: Orthopedic Surgery | Admitting: Orthopedic Surgery

## 2021-04-09 DIAGNOSIS — Z01818 Encounter for other preprocedural examination: Secondary | ICD-10-CM | POA: Diagnosis not present

## 2021-04-09 DIAGNOSIS — M4800 Spinal stenosis, site unspecified: Secondary | ICD-10-CM | POA: Insufficient documentation

## 2021-04-09 HISTORY — DX: Gastro-esophageal reflux disease without esophagitis: K21.9

## 2021-04-09 HISTORY — DX: Essential (primary) hypertension: I10

## 2021-04-09 LAB — PROTIME-INR
INR: 0.9 (ref 0.8–1.2)
Prothrombin Time: 12.3 seconds (ref 11.4–15.2)

## 2021-04-09 LAB — CBC WITH DIFFERENTIAL/PLATELET
Abs Immature Granulocytes: 0.03 10*3/uL (ref 0.00–0.07)
Basophils Absolute: 0 10*3/uL (ref 0.0–0.1)
Basophils Relative: 1 %
Eosinophils Absolute: 0.2 10*3/uL (ref 0.0–0.5)
Eosinophils Relative: 2 %
HCT: 46.2 % (ref 39.0–52.0)
Hemoglobin: 15.6 g/dL (ref 13.0–17.0)
Immature Granulocytes: 0 %
Lymphocytes Relative: 34 %
Lymphs Abs: 2.8 10*3/uL (ref 0.7–4.0)
MCH: 33.4 pg (ref 26.0–34.0)
MCHC: 33.8 g/dL (ref 30.0–36.0)
MCV: 98.9 fL (ref 80.0–100.0)
Monocytes Absolute: 0.8 10*3/uL (ref 0.1–1.0)
Monocytes Relative: 10 %
Neutro Abs: 4.5 10*3/uL (ref 1.7–7.7)
Neutrophils Relative %: 53 %
Platelets: 281 10*3/uL (ref 150–400)
RBC: 4.67 MIL/uL (ref 4.22–5.81)
RDW: 11.2 % — ABNORMAL LOW (ref 11.5–15.5)
WBC: 8.3 10*3/uL (ref 4.0–10.5)
nRBC: 0 % (ref 0.0–0.2)

## 2021-04-09 LAB — URINALYSIS, ROUTINE W REFLEX MICROSCOPIC
Bacteria, UA: NONE SEEN
Bilirubin Urine: NEGATIVE
Glucose, UA: NEGATIVE mg/dL
Hgb urine dipstick: NEGATIVE
Ketones, ur: NEGATIVE mg/dL
Nitrite: NEGATIVE
Protein, ur: NEGATIVE mg/dL
Specific Gravity, Urine: 1.013 (ref 1.005–1.030)
pH: 6 (ref 5.0–8.0)

## 2021-04-09 LAB — COMPREHENSIVE METABOLIC PANEL
ALT: 31 U/L (ref 0–44)
AST: 26 U/L (ref 15–41)
Albumin: 4.1 g/dL (ref 3.5–5.0)
Alkaline Phosphatase: 58 U/L (ref 38–126)
Anion gap: 8 (ref 5–15)
BUN: 12 mg/dL (ref 8–23)
CO2: 26 mmol/L (ref 22–32)
Calcium: 9.6 mg/dL (ref 8.9–10.3)
Chloride: 104 mmol/L (ref 98–111)
Creatinine, Ser: 0.94 mg/dL (ref 0.61–1.24)
GFR, Estimated: >60 mL/min (ref >60)
Glucose, Bld: 113 mg/dL — ABNORMAL HIGH (ref 70–99)
Potassium: 4 mmol/L (ref 3.5–5.1)
Sodium: 138 mmol/L (ref 135–145)
Total Bilirubin: 0.7 mg/dL (ref 0.3–1.2)
Total Protein: 7.2 g/dL (ref 6.5–8.1)

## 2021-04-09 LAB — TYPE AND SCREEN
ABO/RH(D): A POS
Antibody Screen: NEGATIVE

## 2021-04-09 LAB — SURGICAL PCR SCREEN
MRSA, PCR: POSITIVE — AB
Staphylococcus aureus: POSITIVE — AB

## 2021-04-09 LAB — APTT: aPTT: 31 seconds (ref 24–36)

## 2021-04-09 NOTE — Progress Notes (Signed)
Notified Butch Penny at Dr. Laurena Bering office regarding pt being positive for MRSA/MSSA.   Jacqlyn Larsen, RN

## 2021-04-09 NOTE — Progress Notes (Signed)
PCP - Dr. Yaakov Guthrie Cardiologist - denies  Chest x-ray - n/a EKG - 04/09/21 Stress Test - 5+ years ago. Reported normal. No f/u needed. ECHO - denies Cardiac Cath - denies  Sleep Study - denies CPAP - denies  Blood Thinner Instructions: n/a Aspirin Instructions: L/D around 10/15.   ERAS Protcol -Clear liquids until 0430 DOS. PRE-SURGERY Ensure or G2- (1) pre-surgical ensure.   COVID TEST- Scheduled for Monday, October 24th at [facility] in [place].   Anesthesia review: No  Patient denies shortness of breath, fever, cough and chest pain at PAT appointment   All instructions explained to the patient, with a verbal understanding of the material. Patient agrees to go over the instructions while at home for a better understanding. Patient also instructed to self quarantine after being tested for COVID-19. The opportunity to ask questions was provided.

## 2021-04-15 ENCOUNTER — Other Ambulatory Visit (HOSPITAL_COMMUNITY)
Admission: RE | Admit: 2021-04-15 | Discharge: 2021-04-15 | Disposition: A | Discharge status: Home / Self Care | Payer: Medicare Other | Source: Ambulatory Visit | Attending: Orthopedic Surgery | Admitting: Orthopedic Surgery

## 2021-04-15 DIAGNOSIS — Z96643 Presence of artificial hip joint, bilateral: Secondary | ICD-10-CM | POA: Diagnosis not present

## 2021-04-15 DIAGNOSIS — M62838 Other muscle spasm: Secondary | ICD-10-CM | POA: Diagnosis present

## 2021-04-15 DIAGNOSIS — Z01818 Encounter for other preprocedural examination: Secondary | ICD-10-CM

## 2021-04-15 DIAGNOSIS — M4316 Spondylolisthesis, lumbar region: Secondary | ICD-10-CM | POA: Diagnosis not present

## 2021-04-15 DIAGNOSIS — M2578 Osteophyte, vertebrae: Secondary | ICD-10-CM | POA: Diagnosis not present

## 2021-04-15 DIAGNOSIS — Z20822 Contact with and (suspected) exposure to covid-19: Secondary | ICD-10-CM | POA: Diagnosis not present

## 2021-04-15 DIAGNOSIS — K219 Gastro-esophageal reflux disease without esophagitis: Secondary | ICD-10-CM | POA: Diagnosis not present

## 2021-04-15 DIAGNOSIS — M4186 Other forms of scoliosis, lumbar region: Secondary | ICD-10-CM | POA: Diagnosis not present

## 2021-04-15 DIAGNOSIS — M532X6 Spinal instabilities, lumbar region: Secondary | ICD-10-CM | POA: Diagnosis not present

## 2021-04-15 DIAGNOSIS — M5416 Radiculopathy, lumbar region: Secondary | ICD-10-CM | POA: Diagnosis not present

## 2021-04-15 DIAGNOSIS — I10 Essential (primary) hypertension: Secondary | ICD-10-CM | POA: Diagnosis not present

## 2021-04-15 DIAGNOSIS — Z79899 Other long term (current) drug therapy: Secondary | ICD-10-CM | POA: Diagnosis not present

## 2021-04-15 DIAGNOSIS — M48061 Spinal stenosis, lumbar region without neurogenic claudication: Secondary | ICD-10-CM | POA: Diagnosis not present

## 2021-04-15 DIAGNOSIS — Z87891 Personal history of nicotine dependence: Secondary | ICD-10-CM | POA: Diagnosis not present

## 2021-04-15 DIAGNOSIS — M4807 Spinal stenosis, lumbosacral region: Secondary | ICD-10-CM | POA: Diagnosis not present

## 2021-04-15 DIAGNOSIS — Z01812 Encounter for preprocedural laboratory examination: Secondary | ICD-10-CM | POA: Insufficient documentation

## 2021-04-15 DIAGNOSIS — M4326 Fusion of spine, lumbar region: Secondary | ICD-10-CM | POA: Diagnosis not present

## 2021-04-15 DIAGNOSIS — Z981 Arthrodesis status: Secondary | ICD-10-CM | POA: Diagnosis not present

## 2021-04-15 DIAGNOSIS — M4156 Other secondary scoliosis, lumbar region: Secondary | ICD-10-CM | POA: Diagnosis not present

## 2021-04-15 DIAGNOSIS — Z7982 Long term (current) use of aspirin: Secondary | ICD-10-CM | POA: Diagnosis not present

## 2021-04-15 DIAGNOSIS — F1090 Alcohol use, unspecified, uncomplicated: Secondary | ICD-10-CM | POA: Diagnosis not present

## 2021-04-15 LAB — SARS CORONAVIRUS 2 (TAT 6-24 HRS): SARS Coronavirus 2: NEGATIVE

## 2021-04-18 ENCOUNTER — Inpatient Hospital Stay (HOSPITAL_COMMUNITY): Payer: Medicare Other | Admitting: Certified Registered Nurse Anesthetist

## 2021-04-18 ENCOUNTER — Inpatient Hospital Stay (HOSPITAL_COMMUNITY): Payer: Medicare Other

## 2021-04-18 ENCOUNTER — Inpatient Hospital Stay (HOSPITAL_COMMUNITY)
Admission: RE | Admit: 2021-04-18 | Discharge: 2021-04-20 | DRG: 455 | Disposition: A | Discharge status: Home / Self Care | Payer: Medicare Other | Source: Ambulatory Visit | Attending: Orthopedic Surgery | Admitting: Orthopedic Surgery

## 2021-04-18 ENCOUNTER — Encounter (HOSPITAL_COMMUNITY)
Admission: RE | Disposition: A | Discharge status: Home / Self Care | Payer: Self-pay | Source: Ambulatory Visit | Attending: Orthopedic Surgery

## 2021-04-18 ENCOUNTER — Encounter (HOSPITAL_COMMUNITY): Payer: Self-pay | Admitting: Orthopedic Surgery

## 2021-04-18 DIAGNOSIS — Z79899 Other long term (current) drug therapy: Secondary | ICD-10-CM | POA: Diagnosis not present

## 2021-04-18 DIAGNOSIS — M2578 Osteophyte, vertebrae: Secondary | ICD-10-CM | POA: Diagnosis not present

## 2021-04-18 DIAGNOSIS — Z20822 Contact with and (suspected) exposure to covid-19: Secondary | ICD-10-CM | POA: Diagnosis present

## 2021-04-18 DIAGNOSIS — M4800 Spinal stenosis, site unspecified: Secondary | ICD-10-CM | POA: Diagnosis present

## 2021-04-18 DIAGNOSIS — F1090 Alcohol use, unspecified, uncomplicated: Secondary | ICD-10-CM | POA: Diagnosis present

## 2021-04-18 DIAGNOSIS — K219 Gastro-esophageal reflux disease without esophagitis: Secondary | ICD-10-CM | POA: Diagnosis present

## 2021-04-18 DIAGNOSIS — M4326 Fusion of spine, lumbar region: Secondary | ICD-10-CM | POA: Diagnosis not present

## 2021-04-18 DIAGNOSIS — Z7982 Long term (current) use of aspirin: Secondary | ICD-10-CM | POA: Diagnosis not present

## 2021-04-18 DIAGNOSIS — M4186 Other forms of scoliosis, lumbar region: Secondary | ICD-10-CM | POA: Diagnosis not present

## 2021-04-18 DIAGNOSIS — I10 Essential (primary) hypertension: Secondary | ICD-10-CM | POA: Diagnosis present

## 2021-04-18 DIAGNOSIS — Z96643 Presence of artificial hip joint, bilateral: Secondary | ICD-10-CM | POA: Diagnosis present

## 2021-04-18 DIAGNOSIS — M4807 Spinal stenosis, lumbosacral region: Secondary | ICD-10-CM | POA: Diagnosis present

## 2021-04-18 DIAGNOSIS — M532X6 Spinal instabilities, lumbar region: Secondary | ICD-10-CM | POA: Diagnosis not present

## 2021-04-18 DIAGNOSIS — M4316 Spondylolisthesis, lumbar region: Secondary | ICD-10-CM | POA: Diagnosis present

## 2021-04-18 DIAGNOSIS — M48061 Spinal stenosis, lumbar region without neurogenic claudication: Secondary | ICD-10-CM | POA: Diagnosis present

## 2021-04-18 DIAGNOSIS — M62838 Other muscle spasm: Secondary | ICD-10-CM | POA: Diagnosis present

## 2021-04-18 DIAGNOSIS — M4156 Other secondary scoliosis, lumbar region: Secondary | ICD-10-CM | POA: Diagnosis present

## 2021-04-18 DIAGNOSIS — M5416 Radiculopathy, lumbar region: Secondary | ICD-10-CM | POA: Diagnosis present

## 2021-04-18 DIAGNOSIS — Z419 Encounter for procedure for purposes other than remedying health state, unspecified: Secondary | ICD-10-CM

## 2021-04-18 DIAGNOSIS — Z87891 Personal history of nicotine dependence: Secondary | ICD-10-CM

## 2021-04-18 DIAGNOSIS — Z981 Arthrodesis status: Secondary | ICD-10-CM | POA: Diagnosis not present

## 2021-04-18 HISTORY — PX: TRANSFORAMINAL LUMBAR INTERBODY FUSION (TLIF) WITH PEDICLE SCREW FIXATION 4 LEVEL: SHX6144

## 2021-04-18 LAB — ABO/RH: ABO/RH(D): A POS

## 2021-04-18 SURGERY — TRANSFORAMINAL LUMBAR INTERBODY FUSION (TLIF) WITH PEDICLE SCREW FIXATION 4 LEVEL
Anesthesia: General | Laterality: Left

## 2021-04-18 MED ORDER — ADULT MULTIVITAMIN W/MINERALS CH: 1.0000 | ORAL_TABLET | Freq: Every day | ORAL | Status: DC

## 2021-04-18 MED ORDER — MIDAZOLAM HCL 5 MG/5ML IJ SOLN
INTRAMUSCULAR | Status: DC | PRN
  Administered 2021-04-18: 2 mg via INTRAVENOUS

## 2021-04-18 MED ORDER — ZOLPIDEM TARTRATE 5 MG PO TABS: 5.0000 mg | ORAL_TABLET | Freq: Every evening | ORAL | Status: DC | PRN

## 2021-04-18 MED ORDER — BUPIVACAINE LIPOSOME 1.3 % IJ SUSP
INTRAMUSCULAR | Status: AC
  Filled 2021-04-18: qty 20

## 2021-04-18 MED ORDER — VITAMIN B-12 1000 MCG PO TABS: 1000.0000 ug | ORAL_TABLET | Freq: Every day | ORAL | Status: DC

## 2021-04-18 MED ORDER — HYDROXYZINE HCL 50 MG/ML IM SOLN: 50.0000 mg | Freq: Four times a day (QID) | INTRAMUSCULAR | Status: DC | PRN

## 2021-04-18 MED ORDER — ONDANSETRON HCL 4 MG/2ML IJ SOLN: 4.0000 mg | Freq: Four times a day (QID) | INTRAMUSCULAR | Status: DC | PRN

## 2021-04-18 MED ORDER — OXYCODONE HCL 5 MG PO TABS
5.0000 mg | ORAL_TABLET | Freq: Once | ORAL | Status: AC | PRN
  Administered 2021-04-18: 5 mg via ORAL

## 2021-04-18 MED ORDER — SENNOSIDES-DOCUSATE SODIUM 8.6-50 MG PO TABS: 1.0000 | ORAL_TABLET | Freq: Every evening | ORAL | Status: DC | PRN

## 2021-04-18 MED ORDER — CHLORHEXIDINE GLUCONATE CLOTH 2 % EX PADS
6.0000 | MEDICATED_PAD | Freq: Every day | CUTANEOUS | Status: DC
  Administered 2021-04-19 – 2021-04-20 (×2): 6 via TOPICAL

## 2021-04-18 MED ORDER — ONDANSETRON HCL 4 MG PO TABS
4.0000 mg | ORAL_TABLET | Freq: Four times a day (QID) | ORAL | Status: DC | PRN
Start: 2021-04-18 — End: 2021-04-20

## 2021-04-18 MED ORDER — POTASSIUM CHLORIDE IN NACL 20-0.9 MEQ/L-% IV SOLN: INTRAVENOUS | Status: DC

## 2021-04-18 MED ORDER — METHOCARBAMOL 1000 MG/10ML IJ SOLN
500.0000 mg | Freq: Four times a day (QID) | INTRAVENOUS | Status: DC | PRN
  Filled 2021-04-18: qty 5

## 2021-04-18 MED ORDER — VASOPRESSIN 20 UNIT/ML IV SOLN
INTRAVENOUS | Status: AC
  Filled 2021-04-18: qty 1

## 2021-04-18 MED ORDER — BUPIVACAINE LIPOSOME 1.3 % IJ SUSP
INTRAMUSCULAR | Status: DC | PRN
  Administered 2021-04-18: 14 mL

## 2021-04-18 MED ORDER — PHENYLEPHRINE HCL-NACL 20-0.9 MG/250ML-% IV SOLN
INTRAVENOUS | Status: DC | PRN
  Administered 2021-04-18: 50 ug/min via INTRAVENOUS

## 2021-04-18 MED ORDER — DIPHENHYDRAMINE HCL 50 MG/ML IJ SOLN
INTRAMUSCULAR | Status: DC | PRN
  Administered 2021-04-18: 6.25 mg via INTRAVENOUS

## 2021-04-18 MED ORDER — LACTATED RINGERS IV SOLN: INTRAVENOUS | Status: DC

## 2021-04-18 MED ORDER — PROPOFOL 10 MG/ML IV BOLUS
INTRAVENOUS | Status: AC
  Filled 2021-04-18: qty 20

## 2021-04-18 MED ORDER — BUPIVACAINE-EPINEPHRINE 0.25% -1:200000 IJ SOLN
INTRAMUSCULAR | Status: DC | PRN
  Administered 2021-04-18: 9 mL
  Administered 2021-04-18: 14 mL

## 2021-04-18 MED ORDER — PHENOL 1.4 % MT LIQD: 1.0000 | OROMUCOSAL | Status: DC | PRN

## 2021-04-18 MED ORDER — THROMBIN 20000 UNITS EX SOLR
CUTANEOUS | Status: DC | PRN
  Administered 2021-04-18: 20 mL

## 2021-04-18 MED ORDER — ASCORBIC ACID 500 MG PO TABS: 500.0000 mg | ORAL_TABLET | Freq: Every day | ORAL | Status: DC

## 2021-04-18 MED ORDER — MIDAZOLAM HCL 2 MG/2ML IJ SOLN
INTRAMUSCULAR | Status: AC
  Filled 2021-04-18: qty 2

## 2021-04-18 MED ORDER — IRBESARTAN 150 MG PO TABS
150.0000 mg | ORAL_TABLET | Freq: Every day | ORAL | Status: DC
  Administered 2021-04-18 – 2021-04-19 (×2): 150 mg via ORAL
  Filled 2021-04-18 (×2): qty 1

## 2021-04-18 MED ORDER — MORPHINE SULFATE (PF) 2 MG/ML IV SOLN
2.0000 mg | INTRAVENOUS | Status: DC | PRN
  Administered 2021-04-18 – 2021-04-19 (×2): 2 mg via INTRAVENOUS
  Filled 2021-04-18 (×2): qty 1

## 2021-04-18 MED ORDER — SODIUM CHLORIDE 0.9% FLUSH
3.0000 mL | Freq: Two times a day (BID) | INTRAVENOUS | Status: DC
  Administered 2021-04-19 (×2): 3 mL via INTRAVENOUS

## 2021-04-18 MED ORDER — CEFAZOLIN SODIUM-DEXTROSE 2-4 GM/100ML-% IV SOLN
INTRAVENOUS | Status: AC
  Filled 2021-04-18: qty 100

## 2021-04-18 MED ORDER — SIMVASTATIN 20 MG PO TABS
40.0000 mg | ORAL_TABLET | Freq: Every day | ORAL | Status: DC
  Administered 2021-04-19: 40 mg via ORAL
  Filled 2021-04-18: qty 2

## 2021-04-18 MED ORDER — FENTANYL CITRATE (PF) 250 MCG/5ML IJ SOLN
INTRAMUSCULAR | Status: AC
  Filled 2021-04-18: qty 5

## 2021-04-18 MED ORDER — ONDANSETRON HCL 4 MG/2ML IJ SOLN
INTRAMUSCULAR | Status: DC | PRN
  Administered 2021-04-18: 4 mg via INTRAVENOUS

## 2021-04-18 MED ORDER — THROMBIN 20000 UNITS EX SOLR
CUTANEOUS | Status: AC
  Filled 2021-04-18: qty 20000

## 2021-04-18 MED ORDER — ASPIRIN EC 81 MG PO TBEC
81.0000 mg | DELAYED_RELEASE_TABLET | Freq: Every day | ORAL | Status: DC
  Administered 2021-04-18 – 2021-04-19 (×2): 81 mg via ORAL
  Filled 2021-04-18 (×2): qty 1

## 2021-04-18 MED ORDER — ACETAMINOPHEN 650 MG RE SUPP: 650.0000 mg | RECTAL | Status: DC | PRN

## 2021-04-18 MED ORDER — OXYCODONE HCL 5 MG PO TABS: 5.0000 mg | ORAL_TABLET | ORAL | Status: DC | PRN

## 2021-04-18 MED ORDER — ZINC GLUCONATE 50 MG PO TABS: 50.0000 mg | ORAL_TABLET | Freq: Every day | ORAL | Status: DC

## 2021-04-18 MED ORDER — ROCURONIUM BROMIDE 10 MG/ML (PF) SYRINGE
PREFILLED_SYRINGE | INTRAVENOUS | Status: DC | PRN
  Administered 2021-04-18: 50 mg via INTRAVENOUS
  Administered 2021-04-18: 40 mg via INTRAVENOUS
  Administered 2021-04-18: 60 mg via INTRAVENOUS
  Administered 2021-04-18: 50 mg via INTRAVENOUS

## 2021-04-18 MED ORDER — EPHEDRINE SULFATE-NACL 50-0.9 MG/10ML-% IV SOSY
PREFILLED_SYRINGE | INTRAVENOUS | Status: DC | PRN
  Administered 2021-04-18: 5 mg via INTRAVENOUS

## 2021-04-18 MED ORDER — FENTANYL CITRATE (PF) 250 MCG/5ML IJ SOLN
INTRAMUSCULAR | Status: DC | PRN
  Administered 2021-04-18 (×7): 50 ug via INTRAVENOUS
  Administered 2021-04-18: 150 ug via INTRAVENOUS

## 2021-04-18 MED ORDER — METHOCARBAMOL 500 MG PO TABS
500.0000 mg | ORAL_TABLET | Freq: Four times a day (QID) | ORAL | Status: DC | PRN
  Administered 2021-04-18 – 2021-04-20 (×5): 500 mg via ORAL
  Filled 2021-04-18 (×5): qty 1

## 2021-04-18 MED ORDER — KETAMINE HCL 50 MG/5ML IJ SOSY
PREFILLED_SYRINGE | INTRAMUSCULAR | Status: AC
  Filled 2021-04-18: qty 5

## 2021-04-18 MED ORDER — SODIUM CHLORIDE 0.9 % IV SOLN: INTRAVENOUS | Status: DC | PRN

## 2021-04-18 MED ORDER — 0.9 % SODIUM CHLORIDE (POUR BTL) OPTIME
TOPICAL | Status: DC | PRN
  Administered 2021-04-18 (×5): 1000 mL

## 2021-04-18 MED ORDER — FENTANYL CITRATE (PF) 100 MCG/2ML IJ SOLN
25.0000 ug | INTRAMUSCULAR | Status: DC | PRN
  Administered 2021-04-18: 50 ug via INTRAVENOUS
  Administered 2021-04-18: 25 ug via INTRAVENOUS
  Administered 2021-04-18: 50 ug via INTRAVENOUS

## 2021-04-18 MED ORDER — CEFAZOLIN SODIUM-DEXTROSE 2-4 GM/100ML-% IV SOLN: 2.0000 g | INTRAVENOUS | Status: DC

## 2021-04-18 MED ORDER — SODIUM CHLORIDE 0.9 % IV SOLN: 250.0000 mL | INTRAVENOUS | Status: DC

## 2021-04-18 MED ORDER — POVIDONE-IODINE 7.5 % EX SOLN
Freq: Once | CUTANEOUS | Status: DC
  Filled 2021-04-18: qty 118

## 2021-04-18 MED ORDER — MENTHOL 3 MG MT LOZG: 1.0000 | LOZENGE | OROMUCOSAL | Status: DC | PRN

## 2021-04-18 MED ORDER — HYDROMORPHONE HCL 1 MG/ML IJ SOLN
INTRAMUSCULAR | Status: AC
  Filled 2021-04-18: qty 0.5

## 2021-04-18 MED ORDER — KETAMINE HCL 10 MG/ML IJ SOLN
INTRAMUSCULAR | Status: DC | PRN
  Administered 2021-04-18: 50 mg via INTRAVENOUS

## 2021-04-18 MED ORDER — HYDROMORPHONE HCL 1 MG/ML IJ SOLN
INTRAMUSCULAR | Status: DC | PRN
  Administered 2021-04-18 (×4): .25 mg via INTRAVENOUS

## 2021-04-18 MED ORDER — VANCOMYCIN HCL IN DEXTROSE 1-5 GM/200ML-% IV SOLN
1000.0000 mg | INTRAVENOUS | Status: AC
  Administered 2021-04-18: 1000 mg via INTRAVENOUS
  Filled 2021-04-18: qty 200

## 2021-04-18 MED ORDER — OXYCODONE HCL 5 MG/5ML PO SOLN: 5.0000 mg | Freq: Once | ORAL | Status: AC | PRN

## 2021-04-18 MED ORDER — ALUM & MAG HYDROXIDE-SIMETH 200-200-20 MG/5ML PO SUSP: 30.0000 mL | Freq: Four times a day (QID) | ORAL | Status: DC | PRN

## 2021-04-18 MED ORDER — LIDOCAINE 2% (20 MG/ML) 5 ML SYRINGE
INTRAMUSCULAR | Status: DC | PRN
  Administered 2021-04-18: 50 mg via INTRAVENOUS

## 2021-04-18 MED ORDER — GABAPENTIN 600 MG PO TABS
600.0000 mg | ORAL_TABLET | Freq: Three times a day (TID) | ORAL | Status: DC
  Administered 2021-04-18 – 2021-04-19 (×5): 600 mg via ORAL
  Filled 2021-04-18 (×5): qty 1

## 2021-04-18 MED ORDER — FENTANYL CITRATE (PF) 100 MCG/2ML IJ SOLN
INTRAMUSCULAR | Status: AC
  Filled 2021-04-18: qty 2

## 2021-04-18 MED ORDER — METHOCARBAMOL 500 MG PO TABS: 500.0000 mg | ORAL_TABLET | Freq: Four times a day (QID) | ORAL | 2 refills | Status: DC | PRN

## 2021-04-18 MED ORDER — OXYCODONE-ACETAMINOPHEN 5-325 MG PO TABS: 1.0000 | ORAL_TABLET | ORAL | 0 refills | Status: AC | PRN

## 2021-04-18 MED ORDER — CHLORHEXIDINE GLUCONATE 0.12 % MT SOLN
15.0000 mL | Freq: Once | OROMUCOSAL | Status: AC
  Administered 2021-04-18: 15 mL via OROMUCOSAL
  Filled 2021-04-18: qty 15

## 2021-04-18 MED ORDER — OXYCODONE HCL 5 MG PO TABS
10.0000 mg | ORAL_TABLET | ORAL | Status: DC | PRN
  Administered 2021-04-18 – 2021-04-20 (×9): 10 mg via ORAL
  Filled 2021-04-18 (×9): qty 2

## 2021-04-18 MED ORDER — PHENYLEPHRINE HCL (PRESSORS) 10 MG/ML IV SOLN
INTRAVENOUS | Status: DC | PRN
  Administered 2021-04-18 (×2): 120 ug via INTRAVENOUS

## 2021-04-18 MED ORDER — MUPIROCIN 2 % EX OINT
1.0000 "application " | TOPICAL_OINTMENT | Freq: Two times a day (BID) | CUTANEOUS | Status: DC
  Administered 2021-04-18 – 2021-04-19 (×3): 1 via NASAL
  Filled 2021-04-18 (×2): qty 22

## 2021-04-18 MED ORDER — LACTATED RINGERS IV SOLN: INTRAVENOUS | Status: DC | PRN

## 2021-04-18 MED ORDER — TURMERIC 500 MG PO CAPS: 500.0000 mg | ORAL_CAPSULE | Freq: Every day | ORAL | Status: DC

## 2021-04-18 MED ORDER — OXYCODONE HCL 5 MG PO TABS
ORAL_TABLET | ORAL | Status: AC
  Filled 2021-04-18: qty 1

## 2021-04-18 MED ORDER — PANTOPRAZOLE SODIUM 40 MG PO TBEC
40.0000 mg | DELAYED_RELEASE_TABLET | Freq: Every day | ORAL | Status: DC
  Administered 2021-04-19: 40 mg via ORAL
  Filled 2021-04-18: qty 1

## 2021-04-18 MED ORDER — ORAL CARE MOUTH RINSE: 15.0000 mL | Freq: Once | OROMUCOSAL | Status: AC

## 2021-04-18 MED ORDER — VANCOMYCIN HCL IN DEXTROSE 1-5 GM/200ML-% IV SOLN
1000.0000 mg | Freq: Two times a day (BID) | INTRAVENOUS | Status: DC
  Administered 2021-04-18 – 2021-04-20 (×4): 1000 mg via INTRAVENOUS
  Filled 2021-04-18 (×4): qty 200

## 2021-04-18 MED ORDER — SODIUM CHLORIDE 0.9% FLUSH: 3.0000 mL | INTRAVENOUS | Status: DC | PRN

## 2021-04-18 MED ORDER — ONDANSETRON HCL 4 MG/2ML IJ SOLN
INTRAMUSCULAR | Status: AC
  Filled 2021-04-18: qty 2

## 2021-04-18 MED ORDER — BUPIVACAINE-EPINEPHRINE (PF) 0.25% -1:200000 IJ SOLN
INTRAMUSCULAR | Status: AC
  Filled 2021-04-18: qty 30

## 2021-04-18 MED ORDER — ACETAMINOPHEN 10 MG/ML IV SOLN
INTRAVENOUS | Status: DC | PRN
  Administered 2021-04-18: 1000 mg via INTRAVENOUS

## 2021-04-18 MED ORDER — ONDANSETRON HCL 4 MG/2ML IJ SOLN
4.0000 mg | Freq: Four times a day (QID) | INTRAMUSCULAR | Status: AC | PRN
Start: 2021-04-18 — End: 2021-04-18
  Administered 2021-04-18: 4 mg via INTRAVENOUS

## 2021-04-18 MED ORDER — ACETAMINOPHEN 325 MG PO TABS
650.0000 mg | ORAL_TABLET | ORAL | Status: DC | PRN
  Administered 2021-04-18 – 2021-04-20 (×3): 650 mg via ORAL
  Filled 2021-04-18 (×3): qty 2

## 2021-04-18 MED ORDER — PROPOFOL 10 MG/ML IV BOLUS
INTRAVENOUS | Status: DC | PRN
Start: 2021-04-18 — End: 2021-04-18
  Administered 2021-04-18: 150 mg via INTRAVENOUS

## 2021-04-18 SURGICAL SUPPLY — 98 items
BAG COUNTER SPONGE SURGICOUNT (BAG) ×2 IMPLANT
BENZOIN TINCTURE PRP APPL 2/3 (GAUZE/BANDAGES/DRESSINGS) ×2 IMPLANT
BLADE CLIPPER SURG (BLADE) IMPLANT
BUR PRESCISION 1.7 ELITE (BURR) ×2 IMPLANT
BUR ROUND FLUTED 5 RND (BURR) ×4 IMPLANT
BUR ROUND PRECISION 4.0 (BURR) IMPLANT
CAGE SABLE 10X26 6-12 8D (Cage) ×2 IMPLANT
CAGE SABLE 10X30 6-12 8D (Cage) ×2 IMPLANT
CAGE SABLE 10X30 7-14 15D (Cage) ×2 IMPLANT
CAGE SABLE 10X30 9-16 8D (Cage) ×2 IMPLANT
CARTRIDGE OIL MAESTRO DRILL (MISCELLANEOUS) ×1 IMPLANT
CNTNR URN SCR LID CUP LEK RST (MISCELLANEOUS) ×1 IMPLANT
CONT SPEC 4OZ STRL OR WHT (MISCELLANEOUS) ×1
CORD BIPOLAR FORCEPS 12FT (ELECTRODE) ×2 IMPLANT
COVER SURGICAL LIGHT HANDLE (MISCELLANEOUS) ×2 IMPLANT
DIFFUSER DRILL AIR PNEUMATIC (MISCELLANEOUS) ×2 IMPLANT
DRAIN CHANNEL 15F RND FF W/TCR (WOUND CARE) ×2 IMPLANT
DRAPE C-ARM 42X72 X-RAY (DRAPES) ×2 IMPLANT
DRAPE HALF SHEET 40X57 (DRAPES) ×12 IMPLANT
DRAPE POUCH INSTRU U-SHP 10X18 (DRAPES) ×2 IMPLANT
DRAPE SURG 17X23 STRL (DRAPES) ×8 IMPLANT
DRSG MEPILEX BORDER 4X12 (GAUZE/BANDAGES/DRESSINGS) IMPLANT
DRSG MEPILEX BORDER 4X8 (GAUZE/BANDAGES/DRESSINGS) IMPLANT
DURAPREP 26ML APPLICATOR (WOUND CARE) ×2 IMPLANT
ELECT BLADE 4.0 EZ CLEAN MEGAD (MISCELLANEOUS) ×2
ELECT CAUTERY BLADE 6.4 (BLADE) ×4 IMPLANT
ELECT REM PT RETURN 9FT ADLT (ELECTROSURGICAL) ×2
ELECTRODE BLDE 4.0 EZ CLN MEGD (MISCELLANEOUS) ×1 IMPLANT
ELECTRODE REM PT RTRN 9FT ADLT (ELECTROSURGICAL) ×1 IMPLANT
EVACUATOR SILICONE 100CC (DRAIN) ×2 IMPLANT
GAUZE 4X4 16PLY ~~LOC~~+RFID DBL (SPONGE) ×8 IMPLANT
GAUZE SPONGE 4X4 12PLY STRL (GAUZE/BANDAGES/DRESSINGS) ×2 IMPLANT
GLOVE SRG 8 PF TXTR STRL LF DI (GLOVE) ×1 IMPLANT
GLOVE SURG ENC MOIS LTX SZ6.5 (GLOVE) ×2 IMPLANT
GLOVE SURG ENC MOIS LTX SZ8 (GLOVE) ×2 IMPLANT
GLOVE SURG POLYISO LF SZ7 (GLOVE) ×2 IMPLANT
GLOVE SURG UNDER POLY LF SZ7 (GLOVE) ×2 IMPLANT
GLOVE SURG UNDER POLY LF SZ8 (GLOVE) ×1
GOWN STRL REUS W/ TWL LRG LVL3 (GOWN DISPOSABLE) ×3 IMPLANT
GOWN STRL REUS W/ TWL XL LVL3 (GOWN DISPOSABLE) ×2 IMPLANT
GOWN STRL REUS W/TWL LRG LVL3 (GOWN DISPOSABLE) ×6
GOWN STRL REUS W/TWL XL LVL3 (GOWN DISPOSABLE) ×2
IV CATH 14GX2 1/4 (CATHETERS) ×2 IMPLANT
KIT BASIN OR (CUSTOM PROCEDURE TRAY) ×2 IMPLANT
KIT POSITION SURG JACKSON T1 (MISCELLANEOUS) ×2 IMPLANT
KIT TURNOVER KIT B (KITS) ×2 IMPLANT
MARKER SKIN DUAL TIP RULER LAB (MISCELLANEOUS) ×2 IMPLANT
MILL MEDIUM DISP (BLADE) ×2 IMPLANT
MIX DBX 10CC 35% BONE (Bone Implant) ×4 IMPLANT
NEEDLE BONE MARROW 8GX6 FENEST (NEEDLE) IMPLANT
NEEDLE HYPO 25GX1X1/2 BEV (NEEDLE) ×2 IMPLANT
NEEDLE SPNL 18GX3.5 QUINCKE PK (NEEDLE) ×4 IMPLANT
NS IRRIG 1000ML POUR BTL (IV SOLUTION) ×10 IMPLANT
OIL CARTRIDGE MAESTRO DRILL (MISCELLANEOUS) ×2
PACK LAMINECTOMY ORTHO (CUSTOM PROCEDURE TRAY) ×2 IMPLANT
PACK UNIVERSAL I (CUSTOM PROCEDURE TRAY) ×2 IMPLANT
PAD ARMBOARD 7.5X6 YLW CONV (MISCELLANEOUS) ×4 IMPLANT
PATTIES SURGICAL .5 X1 (DISPOSABLE) ×2 IMPLANT
PATTIES SURGICAL .5 X3 (DISPOSABLE) ×2 IMPLANT
PATTIES SURGICAL .5X1.5 (GAUZE/BANDAGES/DRESSINGS) ×2 IMPLANT
PATTIES SURGICAL .75X.75 (GAUZE/BANDAGES/DRESSINGS) ×2 IMPLANT
PUTTY BONE DBX 5CC MIX (Putty) ×2 IMPLANT
ROD EXPEDIUM PREBENT 95MM (Rod) ×4 IMPLANT
SCREW CORTICAL VIPER 7X45MM (Screw) ×4 IMPLANT
SCREW SET SINGLE INNER (Screw) ×16 IMPLANT
SCREW VIPER CORT FIX 6.00X30 (Screw) ×8 IMPLANT
SCREW VIPER CORTICAL FIX 6X40 (Screw) ×4 IMPLANT
SPONGE INTESTINAL PEANUT (DISPOSABLE) ×2 IMPLANT
SPONGE SURGIFOAM ABS GEL 100 (HEMOSTASIS) IMPLANT
SPONGE T-LAP 4X18 ~~LOC~~+RFID (SPONGE) ×8 IMPLANT
STRIP CLOSURE SKIN 1/2X4 (GAUZE/BANDAGES/DRESSINGS) ×2 IMPLANT
SURGIFLO W/THROMBIN 8M KIT (HEMOSTASIS) IMPLANT
SUT BONE WAX W31G (SUTURE) ×2 IMPLANT
SUT ETHILON 2 0 FS 18 (SUTURE) ×2 IMPLANT
SUT ETHILON 2 0 PSLX (SUTURE) IMPLANT
SUT MNCRL AB 3-0 PS2 18 (SUTURE) ×2 IMPLANT
SUT MNCRL AB 4-0 PS2 18 (SUTURE) ×2 IMPLANT
SUT PROLENE 6 0 C 1 24 (SUTURE) IMPLANT
SUT VIC AB 0 CT1 18XCR BRD 8 (SUTURE) ×2 IMPLANT
SUT VIC AB 0 CT1 8-18 (SUTURE) ×4
SUT VIC AB 1 CT1 18XCR BRD 8 (SUTURE) ×2 IMPLANT
SUT VIC AB 1 CT1 8-18 (SUTURE) ×4
SUT VIC AB 2-0 CT2 18 VCP726D (SUTURE) ×4 IMPLANT
SYR 20ML LL LF (SYRINGE) ×2 IMPLANT
SYR 30ML SLIP (SYRINGE) ×2 IMPLANT
SYR BULB IRRIG 60ML STRL (SYRINGE) ×2 IMPLANT
SYR CONTROL 10ML LL (SYRINGE) ×4 IMPLANT
TAP EXPEDIUM DL 4.35 (INSTRUMENTS) ×2 IMPLANT
TAP EXPEDIUM DL 5.0 (INSTRUMENTS) ×2 IMPLANT
TAP EXPEDIUM DL 6.0 (INSTRUMENTS) ×2 IMPLANT
TAP EXPEDIUM DL 7.0 (INSTRUMENTS) ×1
TAP EXPEDIUM DL 7X2 (INSTRUMENTS) ×1 IMPLANT
TAPE CLOTH SURG 4X10 WHT LF (GAUZE/BANDAGES/DRESSINGS) ×2 IMPLANT
TOWEL GREEN STERILE (TOWEL DISPOSABLE) ×2 IMPLANT
TOWEL GREEN STERILE FF (TOWEL DISPOSABLE) ×2 IMPLANT
TRAY FOLEY MTR SLVR 16FR STAT (SET/KITS/TRAYS/PACK) ×2 IMPLANT
WATER STERILE IRR 1000ML POUR (IV SOLUTION) ×2 IMPLANT
YANKAUER SUCT BULB TIP NO VENT (SUCTIONS) ×2 IMPLANT

## 2021-04-18 NOTE — Progress Notes (Signed)
PHARMACIST - PHYSICIAN ORDER COMMUNICATION  CONCERNING: P&T Medication Policy on Herbal Medications  DESCRIPTION:  This patient's orders for:  turmeric, zinc gluconate have been noted.  This product(s) is classified as an "herbal" or natural product. Due to a lack of definitive safety studies or FDA approval, nonstandard manufacturing practices, plus the potential risk of unknown drug-drug interactions while on inpatient medications, the Pharmacy and Therapeutics Committee does not permit the use of "herbal" or natural products of this type within [facility].   ACTION TAKEN: The pharmacy department is unable to verify this order at this time and your patient has been informed of this safety policy. Please reevaluate patient's clinical condition at discharge and address if the herbal or natural product(s) should be resumed at that time.

## 2021-04-18 NOTE — Progress Notes (Signed)
Orthopedic Tech Progress Note Patient Details:  Colton Norris 05-23-1951 [id number] Patient has brace Patient ID: ASHAZ ROBLING, male   DOB: 1950/08/12, 70 y.o.   MRN: [id number]  Ellouise Newer 04/18/2021, 10:05 PM

## 2021-04-18 NOTE — Op Note (Signed)
PATIENT NAME: Colton Norris   MEDICAL RECORD NO.:   [id number]    DATE OF BIRTH: 07-May-1951    DATE OF PROCEDURE: 04/18/2021                                OPERATIVE REPORT     PREOPERATIVE DIAGNOSES: 1. Left-sided lumbar radiculopathy. 2. L3/4, L4-5, L5/S1 spinal stenosis. 3. L3/4 spondylolisthesis 4. Lumbar degenerative scoliosis   POSTOPERATIVE DIAGNOSES: 1. Left-sided lumbar radiculopathy. 2. L3/4, L4-5, L5/S1 spinal stenosis. 3. L3/4 spondylolisthesis 4. Lumbar degenerative scoliosis   PROCEDURES: 1. Lumbar decompression, L3/4, L4-5, L5-S1 2. Left-sided L3/4, L4-5, L5/S1 transforaminal lumbar interbody fusion. 3. Right-sided L3/4, L4-5, L5/S1 posterolateral fusion. 4. Insertion of interbody device x 3 (Globus expandable intervertebral spacers). 5. Placement of posterior instrumentation L3, L4, L5, S1 bilaterally. 6. Use of local autograft. 7. Use of morselized allograft - DBX-mix 8. Intraoperative use of fluoroscopy.   SURGEON:  Phylliss Bob, MD.   ASSISTANT:  Buck Mam, PA-C   ANESTHESIA:  General endotracheal anesthesia.   COMPLICATIONS:  None.   DISPOSITION:  Stable.   ESTIMATED BLOOD LOSS:  450cc (200 cc retransfused via Cell Saver)   INDICATIONS FOR SURGERY:  Briefly, Mr. Piascik is a pleasant 70 y.o.  -year-old patient who did present to me with severe and ongoing pain in the left leg. I did feel that the symptoms were secondary to the findings noted above. The patient failed conservative care and did wish to proceed with the procedure noted above.    OPERATIVE DETAILS:  On 04/18/2021, the patient was brought to surgery and general endotracheal anesthesia was administered.  The patient was placed prone on a well-padded flat Jackson bed with a spinal frame.  Antibiotics were given and a time-out procedure was performed. The back was prepped and draped in the usual fashion.  A midline incision was made overlying the L3/4, L4-5 and L5-S1  intervertebral spaces.  The fascia was incised at the midline.  The paraspinal musculature was bluntly swept laterally.  Anatomic landmarks for the pedicles were exposed. Using fluoroscopy, I did cannulate the L3, L4, L5, and S1 pedicles bilaterally, using a medial to lateral cortical trajectory technique.  On the right side, the posterolateral gutter and facet joints at L3/4, L4-5 and L5-S1 were decorticated and 6 mm screws of the appropriate length were placed at L3, L4 and L5, and a 7 mm screw was placed at S1 and a 95-mm rod was place and distraction was applied across the rod at each intervertebral level.  On the left side, the cannulated pedicle holes were filled with bone wax.  I then proceeded with the decompressive aspect of the procedure.     Starting at L5-S1, I did perform a laminectomy and bilateral partial facetectomy.  In doing so, I was able to meticulously decompress the right and left lateral recess at L5-S1.  I was very pleased with the decompression I was able to accomplish.  Similarly, I performed a bilateral lateral recess decompression at L4-5, and then at L3-4.  I was very pleased with the decompression at each intervertebral level.  I then turned my attention back to the L5-S1 intervertebral space.  At this point, with an assistant holding medial retraction of the traversing left S1 nerve, I did perform a thorough and complete L5-S1 intervertebral discectomy.  The intervertebral space was then liberally packed with autograft from the decompression, as well as allograft  in the form of DBX-mix, as was the appropriately sized intervertebral spacer.  The spacer was expanded to approximately 9 mm in height.  I was very pleased with the press-fit of the spacer. I then turned my attention to the L4-5 level.  With an assistant holding medial retraction of the traversing left L5 nerve, I did perform an annulotomy at the posterolateral aspect of the L4-5 intervertebral space.  I then used  a series of curettes and pituitary rongeurs to perform a thorough and complete intervertebral diskectomy.  The intervertebral space was then liberally packed with autograft as well as allograft in the form of DBX-mix, as was the appropriate-sized intervertebral spacer.  The spacer was then tamped into position in the usual fashion, and was expanded to approximately 14 mm in height.  I was very pleased with the press-fit of the spacer.  I then turned my attention to the L3/4 level.  With an assistant holding medial retraction of the traversing left L4 nerve, I did perform an annulotomy at the posterolateral aspect of the L3/4 intervertebral space.  I then used a series of curettes and pituitary rongeurs to perform a thorough and complete intervertebral diskectomy.  The intervertebral space was then liberally packed with autograft as well as allograft in the form of DBX-mix, as was the appropriate-sized intervertebral spacer.  The spacer was then tamped into position in the usual fashion, and was expanded to approximately 11 mm in height.  I was very pleased with the press-fit of the spacer.  I then placed 6 mm screws on the left at L3, L4 and L5, and a 7 mm screw at S1 on the left.  A 95-mm rod was then placed and caps were placed. The distraction was then released on the contralateral right side at each level.  All 8 caps were then locked.  The wound was copiously irrigated with a total of approximately 3 L prior to placing the bone graft.  Additional autograft and allograft were then packed into the posterolateral gutter on the right side to help aid in the success of the fusion.  The wound was  explored for any undue bleeding and there was no substantial bleeding encountered.  Gel-Foam was placed over the laminectomy site.  Fluoroscopy was used throughout the surgery, and I was very pleased with the final AP and lateral fluoroscopic images.   The wound was then closed in layers using #1  Vicryl followed by 2-0 Vicryl, followed by 4-0 Monocryl.  Benzoin and Steri-Strips were applied followed by sterile dressing.  Of note, a deep #15 Blake drain was placed prior to closure.     Of note, Nehemiah Massed, PA-C was my assistant throughout surgery, and did aid in retraction, suctioning, and closure.       Phylliss Bob, MD

## 2021-04-18 NOTE — Transfer of Care (Signed)
Immediate Anesthesia Transfer of Care Note  Patient: AHAD COLARUSSO  Procedure(s) Performed: LEFT-SIDED LUMBAR 3- LUMBAR 4, LUMBAR 4-, LUMBAR 5, LUMBAR 5- SACRUM 1 TRANSFORAMINAL LUMBAR INTERBODY FUSION WITH INSTRUMENTATION AND ALLOGRAFT (Left)  Patient Location: PACU  Anesthesia Type:General  Level of Consciousness: awake and patient cooperative  Airway & Oxygen Therapy: Patient Spontanous Breathing and Patient connected to face mask oxygen  Post-op Assessment: Report given to RN and Post -op Vital signs reviewed and stable  Post vital signs: Reviewed and stable  Last Vitals:  Vitals Value Taken Time  BP    Temp    Pulse 77 04/18/21 1519  Resp 22 04/18/21 1519  SpO2 96 % 04/18/21 1519  Vitals shown include unvalidated device data.  Last Pain:  Vitals:   04/18/21 0554  TempSrc: Oral         Complications: No notable events documented.

## 2021-04-18 NOTE — Anesthesia Procedure Notes (Signed)
Procedure Name: Intubation Date/Time: 04/18/2021 8:01 AM Performed by: Terrence Dupont, CRNA Pre-anesthesia Checklist: Patient identified, Emergency Drugs available, Suction available and Patient being monitored Patient Re-evaluated:Patient Re-evaluated prior to induction Oxygen Delivery Method: Circle system utilized Preoxygenation: Pre-oxygenation with 100% oxygen Induction Type: IV induction Ventilation: Mask ventilation without difficulty Grade View: Grade I Tube type: Oral Tube size: 7.5 mm Number of attempts: 1 Airway Equipment and Method: Stylet and Oral airway Placement Confirmation: ETT inserted through vocal cords under direct vision, positive ETCO2 and breath sounds checked- equal and bilateral Secured at: 22 cm Tube secured with: Tape Dental Injury: Teeth and Oropharynx as per pre-operative assessment

## 2021-04-18 NOTE — H&P (Signed)
PREOPERATIVE H&P  Chief Complaint: Left leg pain  HPI: Colton Norris is a 70 y.o. male who presents with ongoing pain in the left leg  MRI reveals severe stenosis, a degenerative scoliosis, and instability spanning L3-S1  Patient has failed multiple forms of conservative care and continues to have pain (see office notes for additional details regarding the patient's full course of treatment)  Past Medical History:  Diagnosis Date   GERD (gastroesophageal reflux disease)    Hypertension    Past Surgical History:  Procedure Laterality Date   CERVICAL FUSION  1993   JOINT REPLACEMENT     TOTAL HIP ARTHROPLASTY Left 06/2019   TOTAL HIP ARTHROPLASTY Right 12/2019   Social History   Socioeconomic History   Marital status: Married    Spouse name: Not on file   Number of children: Not on file   Years of education: Not on file   Highest education level: Not on file  Occupational History   Not on file  Tobacco Use   Smoking status: Former    Types: Cigars    Quit date: 2022    Years since quitting: 0.8   Smokeless tobacco: Never  Vaping Use   Vaping Use: Never used  Substance and Sexual Activity   Alcohol use: Yes    Alcohol/week: 6.0 standard drinks    Types: 2 Glasses of wine, 2 Cans of beer, 2 Shots of liquor per week    Comment: 2 drinks daily   Drug use: Never   Sexual activity: Not on file  Other Topics Concern   Not on file  Social History Narrative   Not on file   Social Determinants of Health   Financial Resource Strain: Not on file  Food Insecurity: Not on file  Transportation Needs: Not on file  Physical Activity: Not on file  Stress: Not on file  Social Connections: Not on file   History reviewed. No pertinent family history. No Known Allergies Prior to Admission medications   Medication Sig Start Date End Date Taking? Authorizing Provider  aspirin EC 81 MG tablet Take 81 mg by mouth daily. Swallow whole.   Yes [provider]   gabapentin (NEURONTIN) 600 MG tablet Take 600 mg by mouth 3 (three) times daily.   Yes [provider]  Multiple Vitamins-Minerals (MULTIVITAMIN WITH MINERALS) tablet Take 1 tablet by mouth daily.   Yes [provider]  olmesartan (BENICAR) 20 MG tablet Take 20 mg by mouth daily.   Yes [provider]  Omega-3 Fatty Acids (FISH OIL) 1200 MG CAPS Take 2,400 mg by mouth in the morning and at bedtime.   Yes [provider]  omeprazole (PRILOSEC) 20 MG capsule Take 20 mg by mouth daily.   Yes [provider]  simvastatin (ZOCOR) 40 MG tablet Take 40 mg by mouth daily.   Yes [provider]  Turmeric 500 MG CAPS Take 500 mg by mouth daily.   Yes [provider]  vitamin B-12 (CYANOCOBALAMIN) 1000 MCG tablet Take 1,000 mcg by mouth daily.   Yes [provider]  vitamin C (ASCORBIC ACID) 500 MG tablet Take 500 mg by mouth daily.   Yes [provider]  zinc gluconate 50 MG tablet Take 50 mg by mouth daily.   Yes [provider]  zolpidem (AMBIEN) 10 MG tablet Take 5 mg by mouth at bedtime as needed for sleep.   Yes [provider]     All other  systems have been reviewed and were otherwise negative with the exception of those mentioned in the HPI and as above.  Physical Exam: Vitals:   04/18/21 0554  BP: 105/78  Pulse: 66  Resp: 17  Temp: 98.5 F (36.9 C)  SpO2: 98%    Body mass index is 27.12 kg/m.  General: Alert, no acute distress Cardiovascular: No pedal edema Respiratory: No cyanosis, no use of accessory musculature Skin: No lesions in the area of chief complaint Neurologic: Sensation intact distally Psychiatric: Patient is competent for consent with normal mood and affect Lymphatic: No axillary or cervical lymphadenopathy   Assessment/Plan: SEVERE SPINAL STENOSIS, LEFT LEG PAIN Plan for Procedure(s): LEFT-SIDED LUMBAR 3- LUMBAR 4, LUMBAR 4-, LUMBAR 5, LUMBAR 5- SACRUM 1  TRANSFORAMINAL LUMBAR INTERBODY FUSION WITH INSTRUMENTATION AND ALLOGRAFT   Norva Karvonen, MD 04/18/2021 6:41 AM

## 2021-04-18 NOTE — Progress Notes (Signed)
Pharmacy Antibiotic Note  Colton Norris is a 70 y.o. male with severe stenosis, a degenerative scoliosis, and instability spanning L3-S1. He was admitted on 04/18/2021 with lumbar decompression, interbody fusion.  Pharmacy has been consulted for Vancomycin dosing for surgical prophylaxis.  Drain was placed, thus vancomycin to continue.   Preop labs: Scr = 0.94 (04/15/21) Estimated CrCl is 82.6 ml/min  Plan: Vancomycin 1000 mg IV q12h Monitor clinical status, renal function and culture results daily.  Vancomcyin levels at stead state if continued > 5 days or if renal function worsens.     Height: 6' (182.9 cm) Weight: 90.7 kg (200 lb) IBW/kg (Calculated) : 77.6  Temp (24hrs), Avg:98.2 F (36.8 C), Min:97.8 F (36.6 C), Max:98.5 F (36.9 C)  No results for input(s): WBC, CREATININE, LATICACIDVEN, VANCOTROUGH, VANCOPEAK, VANCORANDOM, GENTTROUGH, GENTPEAK, GENTRANDOM, TOBRATROUGH, TOBRAPEAK, TOBRARND, AMIKACINPEAK, AMIKACINTROU, AMIKACIN in the last 168 hours.  Estimated Creatinine Clearance: 82.6 mL/min (by C-G formula based on SCr of 0.94 mg/dL).    No Known Allergies   Thank you for allowing pharmacy to be a part of this patient's care.  Nicole Cella, RPh Clinical Pharmacist Please check AMION for all [place] phone numbers After 10:00 PM, call [place] [phone]  04/18/2021 5:28 PM

## 2021-04-18 NOTE — Anesthesia Preprocedure Evaluation (Signed)
Anesthesia Evaluation  Patient identified by MRN, date of birth, ID band Patient awake    Reviewed: Allergy & Precautions, H&P , NPO status , Patient's Chart, lab work & pertinent test results  Airway Mallampati: II   Neck ROM: full    Dental   Pulmonary former smoker,    breath sounds clear to auscultation       Cardiovascular hypertension,  Rhythm:regular Rate:Normal     Neuro/Psych    GI/Hepatic GERD  ,  Endo/Other    Renal/GU      Musculoskeletal   Abdominal   Peds  Hematology   Anesthesia Other Findings   Reproductive/Obstetrics                             Anesthesia Physical Anesthesia Plan  ASA: 2  Anesthesia Plan: General   Post-op Pain Management:    Induction: Intravenous  PONV Risk Score and Plan: 2 and Ondansetron, Dexamethasone and Treatment may vary due to age or medical condition  Airway Management Planned: Oral ETT  Additional Equipment:   Intra-op Plan:   Post-operative Plan: Extubation in OR  Informed Consent: I have reviewed the patients History and Physical, chart, labs and discussed the procedure including the risks, benefits and alternatives for the proposed anesthesia with the patient or authorized representative who has indicated his/her understanding and acceptance.     Dental advisory given  Plan Discussed with: CRNA, Anesthesiologist and Surgeon  Anesthesia Plan Comments:         Anesthesia Quick Evaluation

## 2021-04-19 MED ORDER — OXYCODONE HCL ER 10 MG PO T12A
10.0000 mg | EXTENDED_RELEASE_TABLET | Freq: Two times a day (BID) | ORAL | Status: DC
  Administered 2021-04-19 (×2): 10 mg via ORAL
  Filled 2021-04-19 (×2): qty 1

## 2021-04-19 MED FILL — Heparin Sodium (Porcine) Inj 1000 Unit/ML: INTRAMUSCULAR | Qty: 30 | Status: AC

## 2021-04-19 MED FILL — Sodium Chloride Irrigation Soln 0.9%: Qty: 3000 | Status: AC

## 2021-04-19 MED FILL — Sodium Chloride IV Soln 0.9%: INTRAVENOUS | Qty: 1000 | Status: AC

## 2021-04-19 NOTE — Evaluation (Signed)
Occupational Therapy Evaluation Patient Details Name: Colton Norris MRN: [id number] DOB: [birth year] Today's Date: 04/19/2021   History of Present Illness 70 yo M s/p L3-S1 decompression and fusion.  PMH includes: GERD, Hypertension.  Past Surgical History:  CERVICAL FUSION, TOTAL HIP ARTHROPLASTY Left and Right.   Clinical Impression   Patient admitted for the diagnosis and procedure above.  PTA he lives with his spouse, who is able to assist, and remained active, needing no assist with mobility, or ADL support.  Primary deficit is pain.  The patient is completing in room mobility/toileting with Mod I, and requires Min A for LB ADL, but as pain eases, he should return to baseline.  Encouraged mobility throughout the day with staff, all questions answered, and precaution sheet reviewed.        Recommendations for follow up therapy are one component of a multi-disciplinary discharge planning process, led by the attending physician.  Recommendations may be updated based on patient status, additional functional criteria and insurance authorization.   Follow Up Recommendations  No OT follow up    Assistance Recommended at Discharge PRN  Functional Status Assessment  Patient has had a recent decline in their functional status and demonstrates the ability to make significant improvements in function in a reasonable and predictable amount of time.  Equipment Recommendations  None recommended by OT    Recommendations for Other Services       Precautions / Restrictions Precautions Precautions: Back Precaution Booklet Issued: Yes (comment) Required Braces or Orthoses: Spinal Brace Spinal Brace: Applied in sitting position;Thoracolumbosacral orthotic Restrictions Weight Bearing Restrictions: No      Mobility Bed Mobility Overal bed mobility: Needs Assistance Bed Mobility: Sidelying to Sit;Sit to Sidelying   Sidelying to sit: Supervision     Sit to sidelying: Min assist       Transfers Overall transfer level: Needs assistance   Transfers: Sit to/from Stand;Stand Pivot Transfers Sit to Stand: Supervision Stand pivot transfers: Modified independent (Device/Increase time)                Balance Overall balance assessment: Needs assistance Sitting-balance support: Feet supported Sitting balance-Leahy Scale: Good     Standing balance support: Bilateral upper extremity supported Standing balance-Leahy Scale: Fair                             ADL either performed or assessed with clinical judgement   ADL                       Lower Body Dressing: Supervision/safety;Sit to/from stand   Toilet Transfer: Supervision/safety;Rolling walker (2 wheels);Ambulation   Toileting- Clothing Manipulation and Hygiene: Modified independent;Sit to/from stand               Vision Baseline Vision/History: 0 No visual deficits Patient Visual Report: No change from baseline       Perception Perception Perception: Not tested   Praxis Praxis Praxis: Not tested    Pertinent Vitals/Pain Pain Assessment: 0-10 Pain Score: 3  Pain Location: Incision Pain Descriptors / Indicators: Grimacing;Guarding;Operative site guarding Pain Intervention(s): Monitored during session     Hand Dominance Right   Extremity/Trunk Assessment Upper Extremity Assessment Upper Extremity Assessment: Overall WFL for tasks assessed   Lower Extremity Assessment Lower Extremity Assessment: Defer to PT evaluation   Cervical / Trunk Assessment Cervical / Trunk Assessment: Back Surgery   Communication Communication Communication: No difficulties   Cognition Arousal/Alertness:  Awake/alert Behavior During Therapy: WFL for tasks assessed/performed Overall Cognitive Status: Within Functional Limits for tasks assessed                                        Home Living Family/patient expects to be discharged to:: Private residence Living  Arrangements: Spouse/significant other Available Help at Discharge: Family;Available 24 hours/day Type of Home: House Home Access: Stairs to enter [employer] of Steps: 3 Entrance Stairs-Rails: None Home Layout: Two level;Able to live on main level with bedroom/bathroom Alternate Level Stairs-Number of Steps: 14   Bathroom Shower/Tub: Occupational psychologist: Standard     Home Equipment: Conservation officer, nature (2 wheels);Shower seat          Prior Functioning/Environment Prior Level of Function : Independent/Modified Independent             Mobility Comments: Walked without an AD, continues to drive, golfs and remains as active as possible. ADLs Comments: Mod I from sit/stand level.  Has hip kit from hip surgeries.        OT Problem List: Impaired balance (sitting and/or standing);Pain      OT Treatment/Interventions:      OT Goals(Current goals can be found in the care plan section) Acute Rehab OT Goals Patient Stated Goal: Get back to golfing next year. OT Goal Formulation: With patient Time For Goal Achievement: 04/19/21 Potential to Achieve Goals: Good  OT Frequency:     Barriers to D/C:  None noted          Co-evaluation              AM-PAC OT "6 Clicks" Daily Activity     Outcome Measure Help from another person eating meals?: None Help from another person taking care of personal grooming?: None Help from another person toileting, which includes using toliet, bedpan, or urinal?: None Help from another person bathing (including washing, rinsing, drying)?: A Little Help from another person to put on and taking off regular upper body clothing?: None Help from another person to put on and taking off regular lower body clothing?: A Little 6 Click Score: 22   End of Session Equipment Utilized During Treatment: Rolling walker (2 wheels);Back brace Nurse Communication: Mobility status  Activity Tolerance: Patient tolerated treatment  well Patient left: in bed;with call bell/phone within reach;with family/visitor present  OT Visit Diagnosis: Unsteadiness on feet (R26.81);Pain                Time: 7614-7092 OT Time Calculation (min): 26 min Charges:  OT General Charges $OT Visit: 1 Visit OT Evaluation $OT Eval Moderate Complexity: 1 Mod OT Treatments $Self Care/Home Management : 8-22 mins  04/19/2021  RP, OTR/L  Acute Rehabilitation Services  Office:  [phone]   Metta Clines 04/19/2021, 8:43 AM

## 2021-04-19 NOTE — Evaluation (Signed)
Physical Therapy Evaluation and Discharge Patient Details Name: Colton Norris MRN: [id number] DOB: July 24, 1950 Today's Date: 04/19/2021  History of Present Illness  Pt is a 70 y/o M who presents s/p L3-S1 TLIF on 04/18/2021.  PMH significant for HTN, cervical fusion, B THR.  Clinical Impression  Patient evaluated by Physical Therapy with no further acute PT needs identified. All education has been completed and the patient has no further questions. Pt was able to demonstrate transfers and ambulation with gross modified independence. He completed stair training with up to min assist for negotiation without railings. Pt was educated on precautions, brace application/wearing schedule, appropriate activity progression, and car transfer. See below for any follow-up Physical Therapy or equipment needs. PT is signing off. Thank you for this referral.        Recommendations for follow up therapy are one component of a multi-disciplinary discharge planning process, led by the attending physician.  Recommendations may be updated based on patient status, additional functional criteria and insurance authorization.  Follow Up Recommendations No PT follow up    Assistance Recommended at Discharge PRN  Functional Status Assessment Patient has had a recent decline in their functional status and demonstrates the ability to make significant improvements in function in a reasonable and predictable amount of time.  Equipment Recommendations  None recommended by PT    Recommendations for Other Services       Precautions / Restrictions Precautions Precautions: Back;Fall Precaution Booklet Issued: Yes (comment) Required Braces or Orthoses: Spinal Brace Spinal Brace: Applied in sitting position;Thoracolumbosacral orthotic Restrictions Weight Bearing Restrictions: No      Mobility  Bed Mobility Overal bed mobility: Modified Independent Bed Mobility: Sidelying to Sit;Rolling   Sidelying to sit:  Supervision     Sit to sidelying: Min assist General bed mobility comments: HOB flat and rails lowered to simulate home environment. No assist required. Increased time due to pain.    Transfers Overall transfer level: Modified independent Equipment used: Rolling walker (2 wheels) Transfers: Sit to/from Stand Sit to Stand: Supervision Stand pivot transfers: Modified independent (Device/Increase time)         General transfer comment: Light cues for improved posture and hand placement on seated surface for safety with good recall.    Ambulation/Gait Ambulation/Gait assistance: Modified independent (Device/Increase time) Gait Distance (Feet): 400 Feet Assistive device: Rolling walker (2 wheels) Gait Pattern/deviations: Step-through pattern;Decreased stride length;Trunk flexed Gait velocity: Decreased Gait velocity interpretation: 1.31 - 2.62 ft/sec, indicative of limited community ambulator General Gait Details: VC's for improved posture, closer walker proximity, and forward gaze. No LOB noted throughout gait training.  Stairs Stairs: Yes Stairs assistance: Min guard;Min assist Stair Management: One rail Right;No rails;Step to pattern;Forwards Number of Stairs: 10 General stair comments: Pt completed 10 stairs with rail and min guard assist, and 3 additional stairs with son assisting (min assist) and therapist guarding both of them.  Wheelchair Mobility    Modified Rankin (Stroke Patients Only)       Balance Overall balance assessment: Needs assistance Sitting-balance support: Feet supported Sitting balance-Leahy Scale: Good     Standing balance support: Bilateral upper extremity supported Standing balance-Leahy Scale: Fair                               Pertinent Vitals/Pain Pain Assessment: 0-10 Pain Score: 3  Pain Location: Incision Pain Descriptors / Indicators: Grimacing;Guarding;Operative site guarding Pain Intervention(s): Limited activity  within patient's tolerance;Monitored during session;Repositioned  Home Living Family/patient expects to be discharged to:: Private residence Living Arrangements: Spouse/significant other Available Help at Discharge: Family;Available 24 hours/day Type of Home: House Home Access: Stairs to enter Entrance Stairs-Rails: None Entrance Stairs-Number of Steps: 3 Alternate Level Stairs-Number of Steps: 14 Home Layout: Two level;Able to live on main level with bedroom/bathroom Home Equipment: Rolling Walker (2 wheels);Shower seat      Prior Function Prior Level of Function : Independent/Modified Independent             Mobility Comments: Walked without an AD, continues to drive, golfs and remains as active as possible. ADLs Comments: Mod I from sit/stand level.  Has hip kit from hip surgeries.     Hand Dominance   Dominant Hand: Right    Extremity/Trunk Assessment   Upper Extremity Assessment Upper Extremity Assessment: Defer to OT evaluation    Lower Extremity Assessment Lower Extremity Assessment: Generalized weakness (Consistent with pre-op diagnosis)    Cervical / Trunk Assessment Cervical / Trunk Assessment: Back Surgery  Communication   Communication: No difficulties  Cognition Arousal/Alertness: Awake/alert Behavior During Therapy: WFL for tasks assessed/performed Overall Cognitive Status: Within Functional Limits for tasks assessed                                          General Comments      Exercises     Assessment/Plan    PT Assessment Patient does not need any further PT services  PT Problem List Decreased strength;Decreased activity tolerance;Decreased balance;Decreased mobility;Decreased knowledge of use of DME;Decreased knowledge of precautions;Decreased safety awareness;Pain       PT Treatment Interventions DME instruction;Gait training;Stair training;Functional mobility training;Therapeutic activities;Therapeutic  exercise;Neuromuscular re-education;Patient/family education    PT Goals (Current goals can be found in the Care Plan section)  Acute Rehab PT Goals Patient Stated Goal: Pain control PT Goal Formulation: All assessment and education complete, DC therapy    Frequency Min 5X/week   Barriers to discharge        Co-evaluation               AM-PAC PT "6 Clicks" Mobility  Outcome Measure Help needed turning from your back to your side while in a flat bed without using bedrails?: None Help needed moving from lying on your back to sitting on the side of a flat bed without using bedrails?: None Help needed moving to and from a bed to a chair (including a wheelchair)?: None Help needed standing up from a chair using your arms (e.g., wheelchair or bedside chair)?: None Help needed to walk in hospital room?: None Help needed climbing 3-5 steps with a railing? : A Little 6 Click Score: 23    End of Session Equipment Utilized During Treatment: Gait belt;Back brace Activity Tolerance: Patient tolerated treatment well Patient left: in chair;with call bell/phone within reach;with family/visitor present Nurse Communication: Mobility status PT Visit Diagnosis: Unsteadiness on feet (R26.81);Pain Pain - part of body:  (back)    Time: 5790-3833 PT Time Calculation (min) (ACUTE ONLY): 26 min   Charges:   PT Evaluation $PT Eval Low Complexity: 1 Low PT Treatments $Gait Training: 8-22 mins        Rolinda Roan, PT, DPT Acute Rehabilitation Services Pager: [phone] Office: [phone]   Thelma Comp 04/19/2021, 10:10 AM

## 2021-04-19 NOTE — Anesthesia Postprocedure Evaluation (Signed)
Anesthesia Post Note  Patient: Colton Norris  Procedure(s) Performed: LEFT-SIDED LUMBAR 3- LUMBAR 4, LUMBAR 4-, LUMBAR 5, LUMBAR 5- SACRUM 1 TRANSFORAMINAL LUMBAR INTERBODY FUSION WITH INSTRUMENTATION AND ALLOGRAFT (Left)     Patient location during evaluation: PACU Anesthesia Type: General Level of consciousness: awake and alert Pain management: pain level controlled Vital Signs Assessment: post-procedure vital signs reviewed and stable Respiratory status: spontaneous breathing, nonlabored ventilation, respiratory function stable and patient connected to nasal cannula oxygen Cardiovascular status: blood pressure returned to baseline and stable Postop Assessment: no apparent nausea or vomiting Anesthetic complications: no   No notable events documented.  Last Vitals:  Vitals:   04/18/21 2329 04/19/21 0417  BP: 109/75 125/78  Pulse: 81 78  Resp: 20 20  Temp: 36.8 C 37.3 C  SpO2: 98% 97%    Last Pain:  Vitals:   04/19/21 0635  TempSrc:   PainSc: [place]

## 2021-04-19 NOTE — Progress Notes (Signed)
    Patient doing well  Patient reports resolved left leg pain + expected LBP Has been ambulating   Physical Exam: Vitals:   04/18/21 2329 04/19/21 0417  BP: 109/75 125/78  Pulse: 81 78  Resp: 20 20  Temp: 98.3 F (36.8 C) 99.2 F (37.3 C)  SpO2: 98% 97%   Patient appears comfortable Dressing in place NVI  Drain output: 50cc in last 9 hours  POD #1 s/p L3-S1 decompression and fusion, doing well  - up with PT/OT, encourage ambulation - Percocet for pain, Robaxin for muscle spasms - possible d/c home today vs tomorrow, with f/u in 2 weeks, although patient anxious about discharge. Will call and discuss patient's progress with nurse this afternoon.  - maintain drain for now

## 2021-04-20 ENCOUNTER — Encounter (HOSPITAL_COMMUNITY): Payer: Self-pay | Admitting: Orthopedic Surgery

## 2021-04-20 LAB — CREATININE, SERUM
Creatinine, Ser: 0.89 mg/dL (ref 0.61–1.24)
GFR, Estimated: >60 mL/min (ref >60)

## 2021-04-20 MED ORDER — METHOCARBAMOL 500 MG PO TABS: 500.0000 mg | ORAL_TABLET | Freq: Four times a day (QID) | ORAL | 0 refills | Status: DC | PRN

## 2021-04-20 NOTE — Progress Notes (Signed)
Orthopaedics Daily Progress Note   04/20/2021   9:06 AM  Colton Norris is a 70 y.o. male 2 Days Post-Op s/p LEFT-SIDED LUMBAR 3- LUMBAR 4, LUMBAR 4-, LUMBAR 5, LUMBAR 5- SACRUM 1 TRANSFORAMINAL LUMBAR INTERBODY FUSION WITH INSTRUMENTATION AND ALLOGRAFT  Subjective Ready to go home. Denies nausea, vomiting, or fevers. Pain controlled.  Objective Vitals:   04/20/21 0315 04/20/21 0743  BP: 127/79 134/90  Pulse: 91 95  Resp: 20 18  Temp: 98.2 F (36.8 C) 98.8 F (37.1 C)  SpO2: 96% 98%    Intake/Output Summary (Last 24 hours) at 04/20/2021 4239 Last data filed at 04/20/2021 0428 Gross per 24 hour  Intake 480 ml  Output 245 ml  Net 235 ml    Physical Exam RRR NLB Dressing c/d/I NVID  Assessment 70 y.o. male s/p Procedure(s) (LRB): LEFT-SIDED LUMBAR 3- LUMBAR 4, LUMBAR 4-, LUMBAR 5, LUMBAR 5- SACRUM 1 TRANSFORAMINAL LUMBAR INTERBODY FUSION WITH INSTRUMENTATION AND ALLOGRAFT (Left)  Plan Up with PT/OT, encourage ambulation Percocet for pain, Robaxin for muscle spasms Home today  Georgeanna Harrison M.D. Orthopaedic Surgery [facility]

## 2021-04-20 NOTE — Progress Notes (Signed)
Patient is discharged from room 3C09 at this time. Alert and in stable condition. IV site d/c'd and instructions read to patient and spouse with understanding verbalized and all questions answered. Left unit via wheelchair with all belongings at side. .  

## 2021-04-22 DIAGNOSIS — I10 Essential (primary) hypertension: Secondary | ICD-10-CM | POA: Diagnosis not present

## 2021-04-22 DIAGNOSIS — Z7982 Long term (current) use of aspirin: Secondary | ICD-10-CM | POA: Diagnosis not present

## 2021-04-22 DIAGNOSIS — E785 Hyperlipidemia, unspecified: Secondary | ICD-10-CM | POA: Diagnosis not present

## 2021-04-22 DIAGNOSIS — Z48811 Encounter for surgical aftercare following surgery on the nervous system: Secondary | ICD-10-CM | POA: Diagnosis not present

## 2021-04-22 DIAGNOSIS — M48061 Spinal stenosis, lumbar region without neurogenic claudication: Secondary | ICD-10-CM | POA: Diagnosis not present

## 2021-05-28 DIAGNOSIS — M4316 Spondylolisthesis, lumbar region: Secondary | ICD-10-CM | POA: Diagnosis not present

## 2021-05-30 NOTE — Discharge Summary (Signed)
Patient ID: AZARYAH OLEKSY MRN: [id number] DOB/AGE: [birth year] 70 y.o.  Admit date: 04/18/2021 Discharge date: 04/20/2021  Admission Diagnoses:  Active Problems:   Spinal stenosis   Discharge Diagnoses:  Same  Past Medical History:  Diagnosis Date   GERD (gastroesophageal reflux disease)    Hypertension     Surgeries: Procedure(s): LEFT-SIDED LUMBAR 3- LUMBAR 4, LUMBAR 4-, LUMBAR 5, LUMBAR 5- SACRUM 1 TRANSFORAMINAL LUMBAR INTERBODY FUSION WITH INSTRUMENTATION AND ALLOGRAFT on 04/18/2021   Consultants: None  Discharged Condition: Improved  Hospital Course: JAYVYN HASELTON is an 70 y.o. male who was admitted 04/18/2021 for operative treatment of spinal stenosis. Patient has severe unremitting pain that affects sleep, daily activities, and work/hobbies. After pre-op clearance the patient was taken to the operating room on 04/18/2021 and underwent  Procedure(s): LEFT-SIDED LUMBAR 3- LUMBAR 4, LUMBAR 4-, LUMBAR 5, LUMBAR 5- SACRUM 1 TRANSFORAMINAL LUMBAR INTERBODY FUSION WITH INSTRUMENTATION AND ALLOGRAFT.    Patient was given perioperative antibiotics:  Anti-infectives (From admission, onward)    Start     Dose/Rate Route Frequency Ordered Stop   04/18/21 1830  vancomycin (VANCOCIN) IVPB 1000 mg/200 mL premix  Status:  Discontinued        1,000 mg 200 mL/hr over 60 Minutes Intravenous Every 12 hours 04/18/21 1736 04/20/21 1727   04/18/21 0620  ceFAZolin (ANCEF) 2-4 GM/100ML-% IVPB  Status:  Discontinued       Note to Pharmacy: Ladoris Gene   : cabinet override      04/18/21 0620 04/18/21 0640   04/18/21 0600  ceFAZolin (ANCEF) IVPB 2g/100 mL premix  Status:  Discontinued        2 g 200 mL/hr over 30 Minutes Intravenous On call to O.R. 04/18/21 0540 04/18/21 0543   04/18/21 0545  vancomycin (VANCOCIN) IVPB 1000 mg/200 mL premix        1,000 mg 200 mL/hr over 60 Minutes Intravenous To Surgery 04/18/21 0540 04/18/21 0736        Patient was given sequential  compression devices, early ambulation to prevent DVT.  Patient benefited maximally from hospital stay and there were no complications.    Recent vital signs: BP 134/90 (BP Location: Left Arm)   Pulse 95   Temp 98.8 F (37.1 C) (Oral)   Resp 18   Ht 6' (1.829 m)   Wt 90.7 kg   SpO2 98%   BMI 27.12 kg/m    Discharge Medications:   Allergies as of 04/20/2021   No Known Allergies      Medication List     TAKE these medications    aspirin EC 81 MG tablet Take 81 mg by mouth daily. Swallow whole.   Fish Oil 1200 MG Caps Take 2,400 mg by mouth in the morning and at bedtime.   gabapentin 600 MG tablet Commonly known as: NEURONTIN Take 600 mg by mouth 3 (three) times daily.   methocarbamol 500 MG tablet Commonly known as: ROBAXIN Take 1 tablet (500 mg total) by mouth every 6 (six) hours as needed for muscle spasms.   methocarbamol 500 MG tablet Commonly known as: Robaxin Take 1 tablet (500 mg total) by mouth every 6 (six) hours as needed for muscle spasms.   multivitamin with minerals tablet Take 1 tablet by mouth daily.   olmesartan 20 MG tablet Commonly known as: BENICAR Take 20 mg by mouth daily.   omeprazole 20 MG capsule Commonly known as: PRILOSEC Take 20 mg by mouth daily.   oxyCODONE-acetaminophen 5-325 MG  tablet Commonly known as: Percocet Take 1 tablet by mouth every 4 (four) hours as needed for severe pain.   simvastatin 40 MG tablet Commonly known as: ZOCOR Take 40 mg by mouth daily.   Turmeric 500 MG Caps Take 500 mg by mouth daily.   vitamin B-12 1000 MCG tablet Commonly known as: CYANOCOBALAMIN Take 1,000 mcg by mouth daily.   vitamin C 500 MG tablet Commonly known as: ASCORBIC ACID Take 500 mg by mouth daily.   zinc gluconate 50 MG tablet Take 50 mg by mouth daily.   zolpidem 10 MG tablet Commonly known as: AMBIEN Take 5 mg by mouth at bedtime as needed for sleep.        Diagnostic Studies: No results found.  Disposition:  Discharge disposition: 01-Home or Self Care       Discharge Instructions     Call MD / Call 911   Complete by: As directed    If you experience chest pain or shortness of breath, CALL 911 and be transported to the hospital emergency room.  If you develope a fever above 101 F, pus (white drainage) or increased drainage or redness at the wound, or calf pain, call your surgeon's office.   Constipation Prevention   Complete by: As directed    Drink plenty of fluids.  Prune juice may be helpful.  You may use a stool softener, such as Colace (over the counter) 100 mg twice a day.  Use MiraLax (over the counter) for constipation as needed.   Diet - low sodium heart healthy   Complete by: As directed    Increase activity slowly as tolerated   Complete by: As directed    Post-operative opioid taper instructions:   Complete by: As directed    POST-OPERATIVE OPIOID TAPER INSTRUCTIONS: It is important to wean off of your opioid medication as soon as possible. If you do not need pain medication after your surgery it is ok to stop day one. Opioids include: Codeine, Hydrocodone(Norco, Vicodin), Oxycodone(Percocet, oxycontin) and hydromorphone amongst others.  Long term and even short term use of opiods can cause: Increased pain response Dependence Constipation Depression Respiratory depression And more.  Withdrawal symptoms can include Flu like symptoms Nausea, vomiting And more Techniques to manage these symptoms Hydrate well Eat regular healthy meals Stay active Use relaxation techniques(deep breathing, meditating, yoga) Do Not substitute Alcohol to help with tapering If you have been on opioids for less than two weeks and do not have pain than it is ok to stop all together.  Plan to wean off of opioids This plan should start within one week post op of your joint replacement. Maintain the same interval or time between taking each dose and first decrease the dose.  Cut the total daily  intake of opioids by one tablet each day Next start to increase the time between doses. The last dose that should be eliminated is the evening dose.        -Percocet for pain, Robaxin for muscle spasms -Scripts for pain sent to pharmacy electronically  -D/C instructions sheet printed and in chart -D/C today  -F/U in office 2 weeks   Signed: Justice Britain 05/30/2021, 3:47 PM

## 2021-07-09 DIAGNOSIS — Z9889 Other specified postprocedural states: Secondary | ICD-10-CM | POA: Diagnosis not present

## 2021-07-09 DIAGNOSIS — M545 Low back pain, unspecified: Secondary | ICD-10-CM | POA: Diagnosis not present

## 2021-09-02 ENCOUNTER — Ambulatory Visit
Admission: RE | Admit: 2021-09-02 | Discharge: 2021-09-02 | Disposition: A | Discharge status: Home / Self Care | Payer: Medicare Other | Source: Ambulatory Visit | Attending: Family Medicine | Admitting: Family Medicine

## 2021-09-02 ENCOUNTER — Other Ambulatory Visit: Payer: Self-pay

## 2021-09-02 ENCOUNTER — Other Ambulatory Visit: Payer: Self-pay | Admitting: Family Medicine

## 2021-09-02 DIAGNOSIS — E785 Hyperlipidemia, unspecified: Secondary | ICD-10-CM | POA: Diagnosis not present

## 2021-09-02 DIAGNOSIS — Z1389 Encounter for screening for other disorder: Secondary | ICD-10-CM | POA: Diagnosis not present

## 2021-09-02 DIAGNOSIS — G4700 Insomnia, unspecified: Secondary | ICD-10-CM | POA: Diagnosis not present

## 2021-09-02 DIAGNOSIS — R0989 Other specified symptoms and signs involving the circulatory and respiratory systems: Secondary | ICD-10-CM | POA: Diagnosis not present

## 2021-09-02 DIAGNOSIS — Z125 Encounter for screening for malignant neoplasm of prostate: Secondary | ICD-10-CM | POA: Diagnosis not present

## 2021-09-02 DIAGNOSIS — R059 Cough, unspecified: Secondary | ICD-10-CM | POA: Diagnosis not present

## 2021-09-02 DIAGNOSIS — Z87891 Personal history of nicotine dependence: Secondary | ICD-10-CM | POA: Diagnosis not present

## 2021-09-02 DIAGNOSIS — R7303 Prediabetes: Secondary | ICD-10-CM | POA: Diagnosis not present

## 2021-09-02 DIAGNOSIS — Z0000 Encounter for general adult medical examination without abnormal findings: Secondary | ICD-10-CM | POA: Diagnosis not present

## 2021-09-02 DIAGNOSIS — I10 Essential (primary) hypertension: Secondary | ICD-10-CM | POA: Diagnosis not present

## 2021-09-02 DIAGNOSIS — Z23 Encounter for immunization: Secondary | ICD-10-CM | POA: Diagnosis not present

## 2021-10-24 DIAGNOSIS — M5451 Vertebrogenic low back pain: Secondary | ICD-10-CM | POA: Diagnosis not present

## 2021-12-16 DIAGNOSIS — H25013 Cortical age-related cataract, bilateral: Secondary | ICD-10-CM | POA: Diagnosis not present

## 2022-02-12 DIAGNOSIS — D123 Benign neoplasm of transverse colon: Secondary | ICD-10-CM | POA: Diagnosis not present

## 2022-02-12 DIAGNOSIS — K5730 Diverticulosis of large intestine without perforation or abscess without bleeding: Secondary | ICD-10-CM | POA: Diagnosis not present

## 2022-02-12 DIAGNOSIS — K649 Unspecified hemorrhoids: Secondary | ICD-10-CM | POA: Diagnosis not present

## 2022-02-12 DIAGNOSIS — Z86010 Personal history of colonic polyps: Secondary | ICD-10-CM | POA: Diagnosis not present

## 2022-02-12 DIAGNOSIS — Z09 Encounter for follow-up examination after completed treatment for conditions other than malignant neoplasm: Secondary | ICD-10-CM | POA: Diagnosis not present

## 2022-02-17 DIAGNOSIS — D123 Benign neoplasm of transverse colon: Secondary | ICD-10-CM | POA: Diagnosis not present

## 2022-04-29 DIAGNOSIS — I10 Essential (primary) hypertension: Secondary | ICD-10-CM | POA: Diagnosis not present

## 2022-04-29 DIAGNOSIS — E785 Hyperlipidemia, unspecified: Secondary | ICD-10-CM | POA: Diagnosis not present

## 2022-04-29 DIAGNOSIS — Z23 Encounter for immunization: Secondary | ICD-10-CM | POA: Diagnosis not present

## 2022-09-04 DIAGNOSIS — Z6827 Body mass index (BMI) 27.0-27.9, adult: Secondary | ICD-10-CM | POA: Diagnosis not present

## 2022-09-04 DIAGNOSIS — Z1389 Encounter for screening for other disorder: Secondary | ICD-10-CM | POA: Diagnosis not present

## 2022-09-04 DIAGNOSIS — Z0000 Encounter for general adult medical examination without abnormal findings: Secondary | ICD-10-CM | POA: Diagnosis not present

## 2022-12-14 IMAGING — CR DG LUMBAR SPINE 1V
1 series · 1 of 1 positions shown · non-contrast
Comparison: None.

CLINICAL DATA: L3-S1 fusion.

EXAM:
LUMBAR SPINE - 1 VIEW

[lateral]
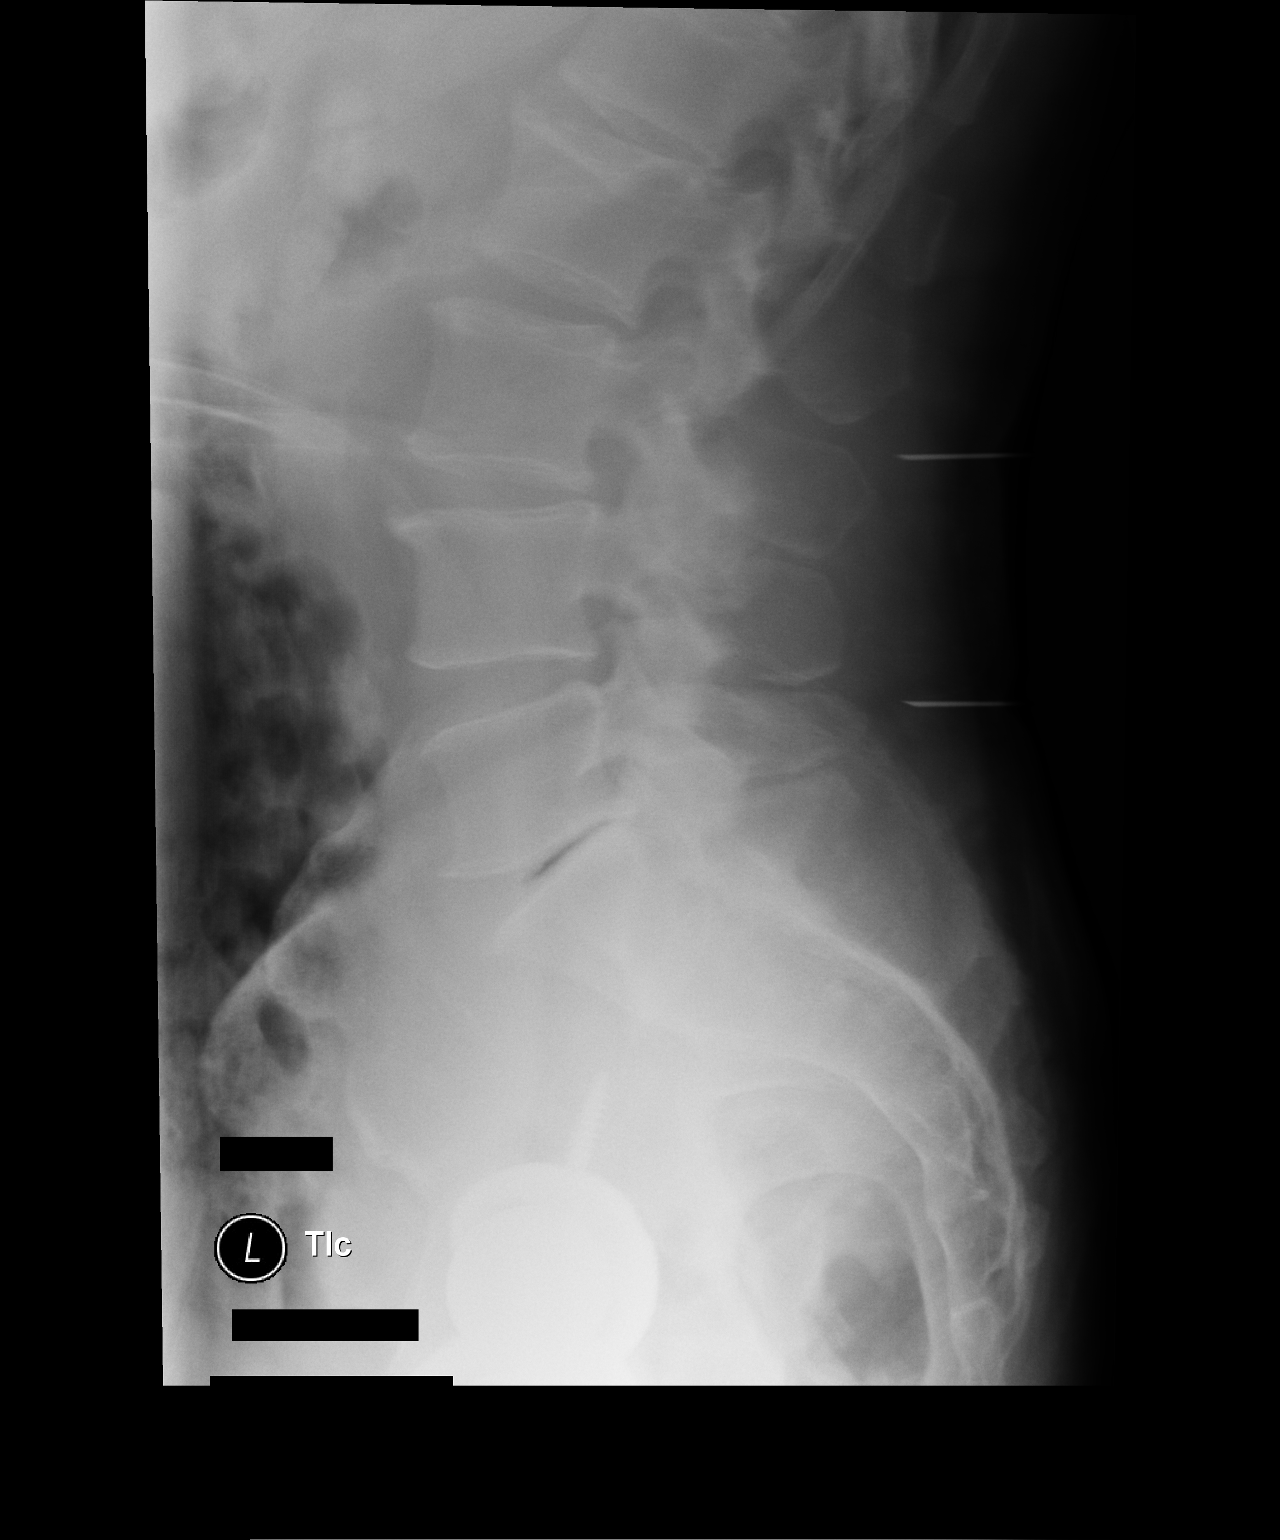

[1 of 1 positions shown; findings below may reference images not displayed]

FINDINGS: Instrumentation is seen posterior to the L4-L5 and L2-L3 spinous
processes. Alignment is anatomic. There is disc space narrowing and
endplate osteophyte formation compatible with degenerative change
most significant at L5-S1.
IMPRESSION: Localization of instrumentation.

## 2022-12-14 IMAGING — RF DG LUMBAR SPINE 2-3V
1 series · 2 of 2 positions shown · non-contrast
Comparison: None.

CLINICAL DATA: L3-S1 fusion.

EXAM:
LUMBAR SPINE - 2-3 VIEW

[Series 1: run · 2 of 2 slices shown]
[im 1/2]
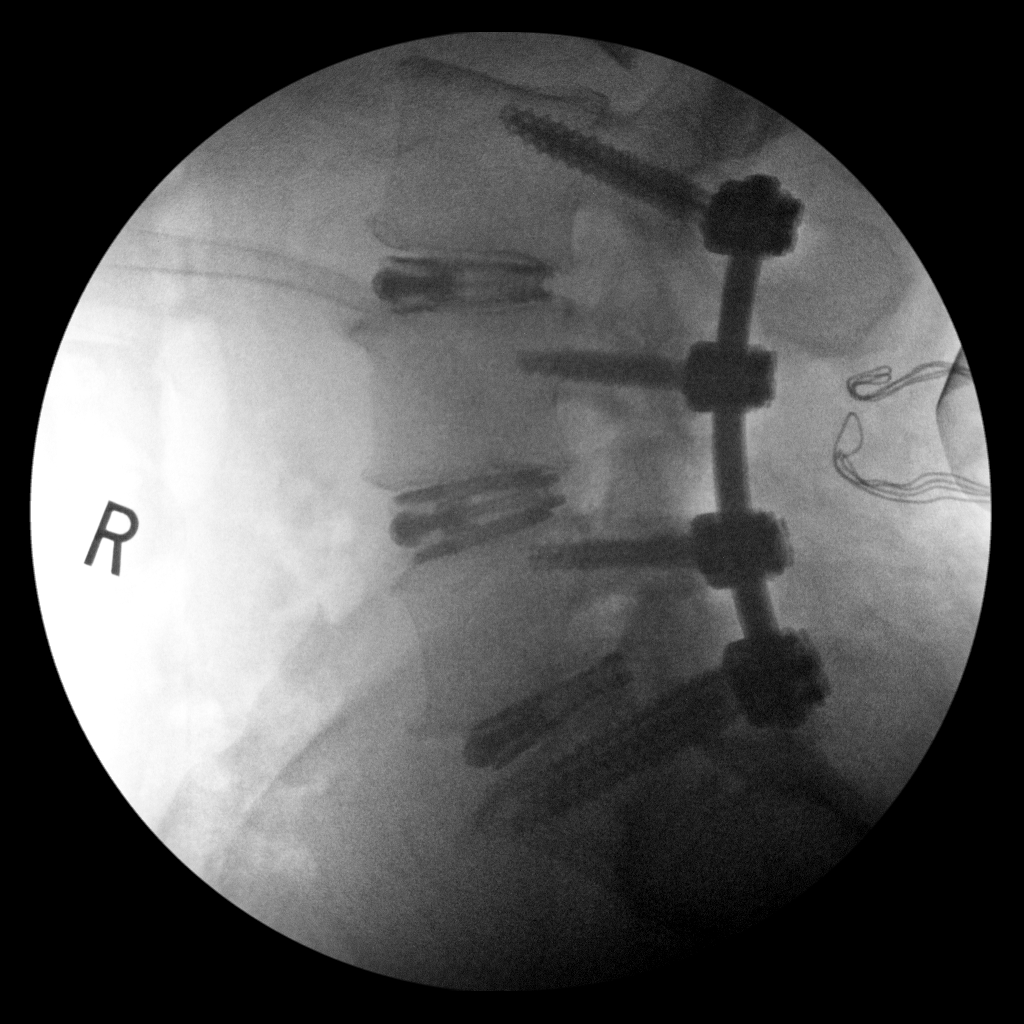
[im 2/2]
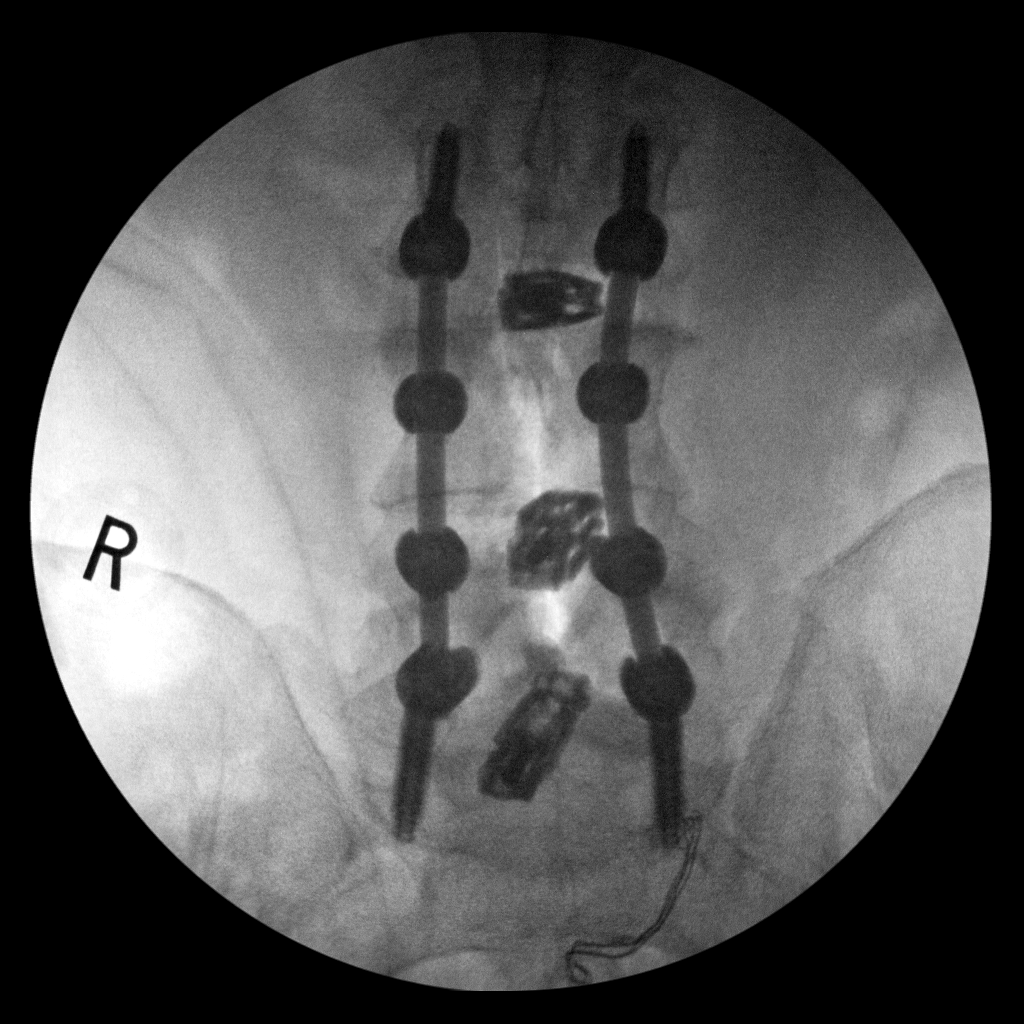

[2 of 2 positions shown; findings below may reference images not displayed]

FINDINGS: Intraoperative lumbar spine.

Two low resolution intraoperative spot views of the lumbar spine
were obtained. L3-S1 posterior fusion with bone cages. Alignment is
anatomic as can be seen. No fractures are identified.

Total fluoroscopy time: 2 minutes 7 seconds

Total radiation dose: 37.41 micro Gy
IMPRESSION: Intraoperative L3-S1 posterior fusion.

## 2022-12-17 DIAGNOSIS — H43813 Vitreous degeneration, bilateral: Secondary | ICD-10-CM | POA: Diagnosis not present

## 2023-03-17 DIAGNOSIS — K219 Gastro-esophageal reflux disease without esophagitis: Secondary | ICD-10-CM | POA: Diagnosis not present

## 2023-03-17 DIAGNOSIS — G4700 Insomnia, unspecified: Secondary | ICD-10-CM | POA: Diagnosis not present

## 2023-03-17 DIAGNOSIS — I10 Essential (primary) hypertension: Secondary | ICD-10-CM | POA: Diagnosis not present

## 2023-03-17 DIAGNOSIS — E785 Hyperlipidemia, unspecified: Secondary | ICD-10-CM | POA: Diagnosis not present

## 2023-03-17 DIAGNOSIS — Z6826 Body mass index (BMI) 26.0-26.9, adult: Secondary | ICD-10-CM | POA: Diagnosis not present

## 2023-03-17 DIAGNOSIS — Z23 Encounter for immunization: Secondary | ICD-10-CM | POA: Diagnosis not present

## 2023-04-30 IMAGING — CR DG CHEST 2V
2 series · 2 of 2 positions shown · non-contrast
Comparison: 04/19/2014

CLINICAL DATA: Chronic intermittent productive cough for 1 month

EXAM:
CHEST - 2 VIEW

[w chest pa]
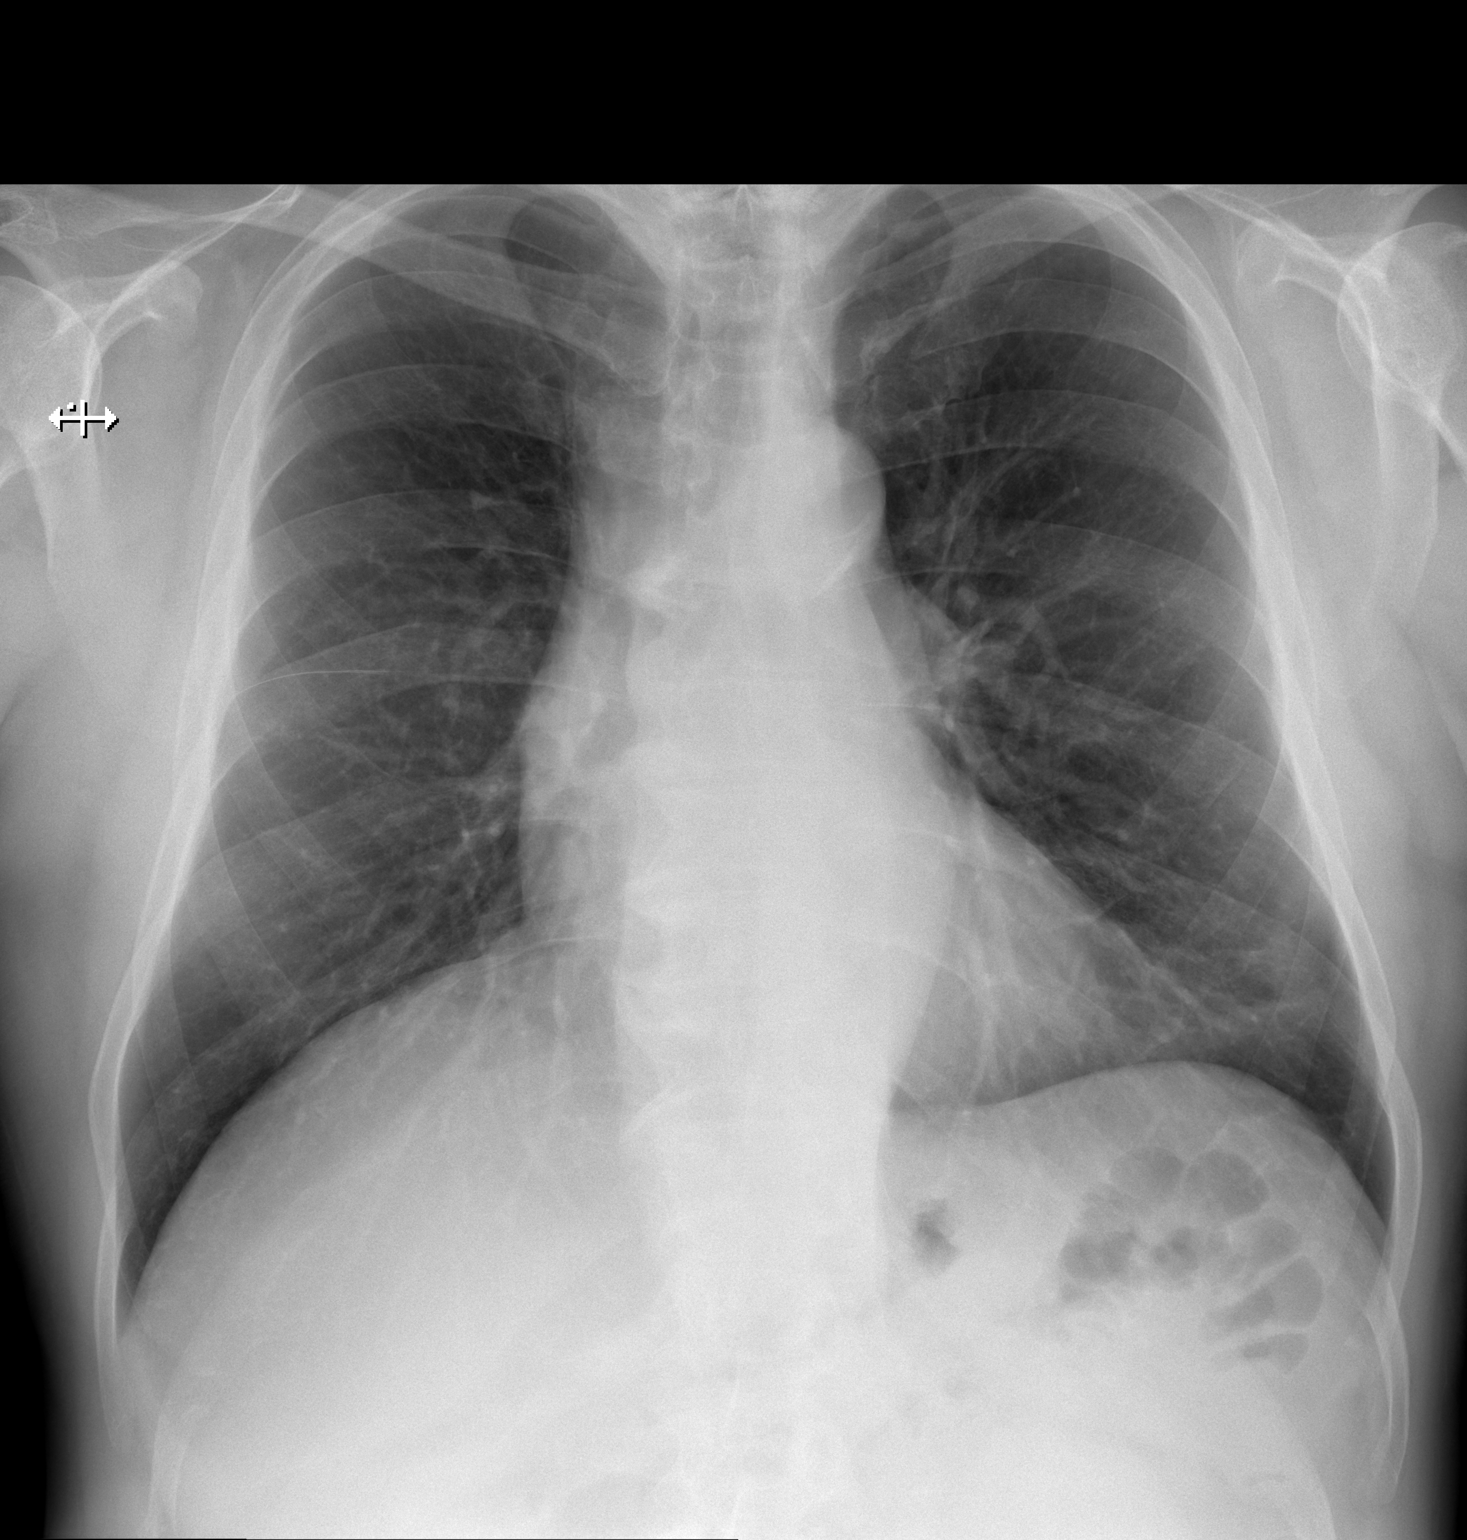

[w chest lat]
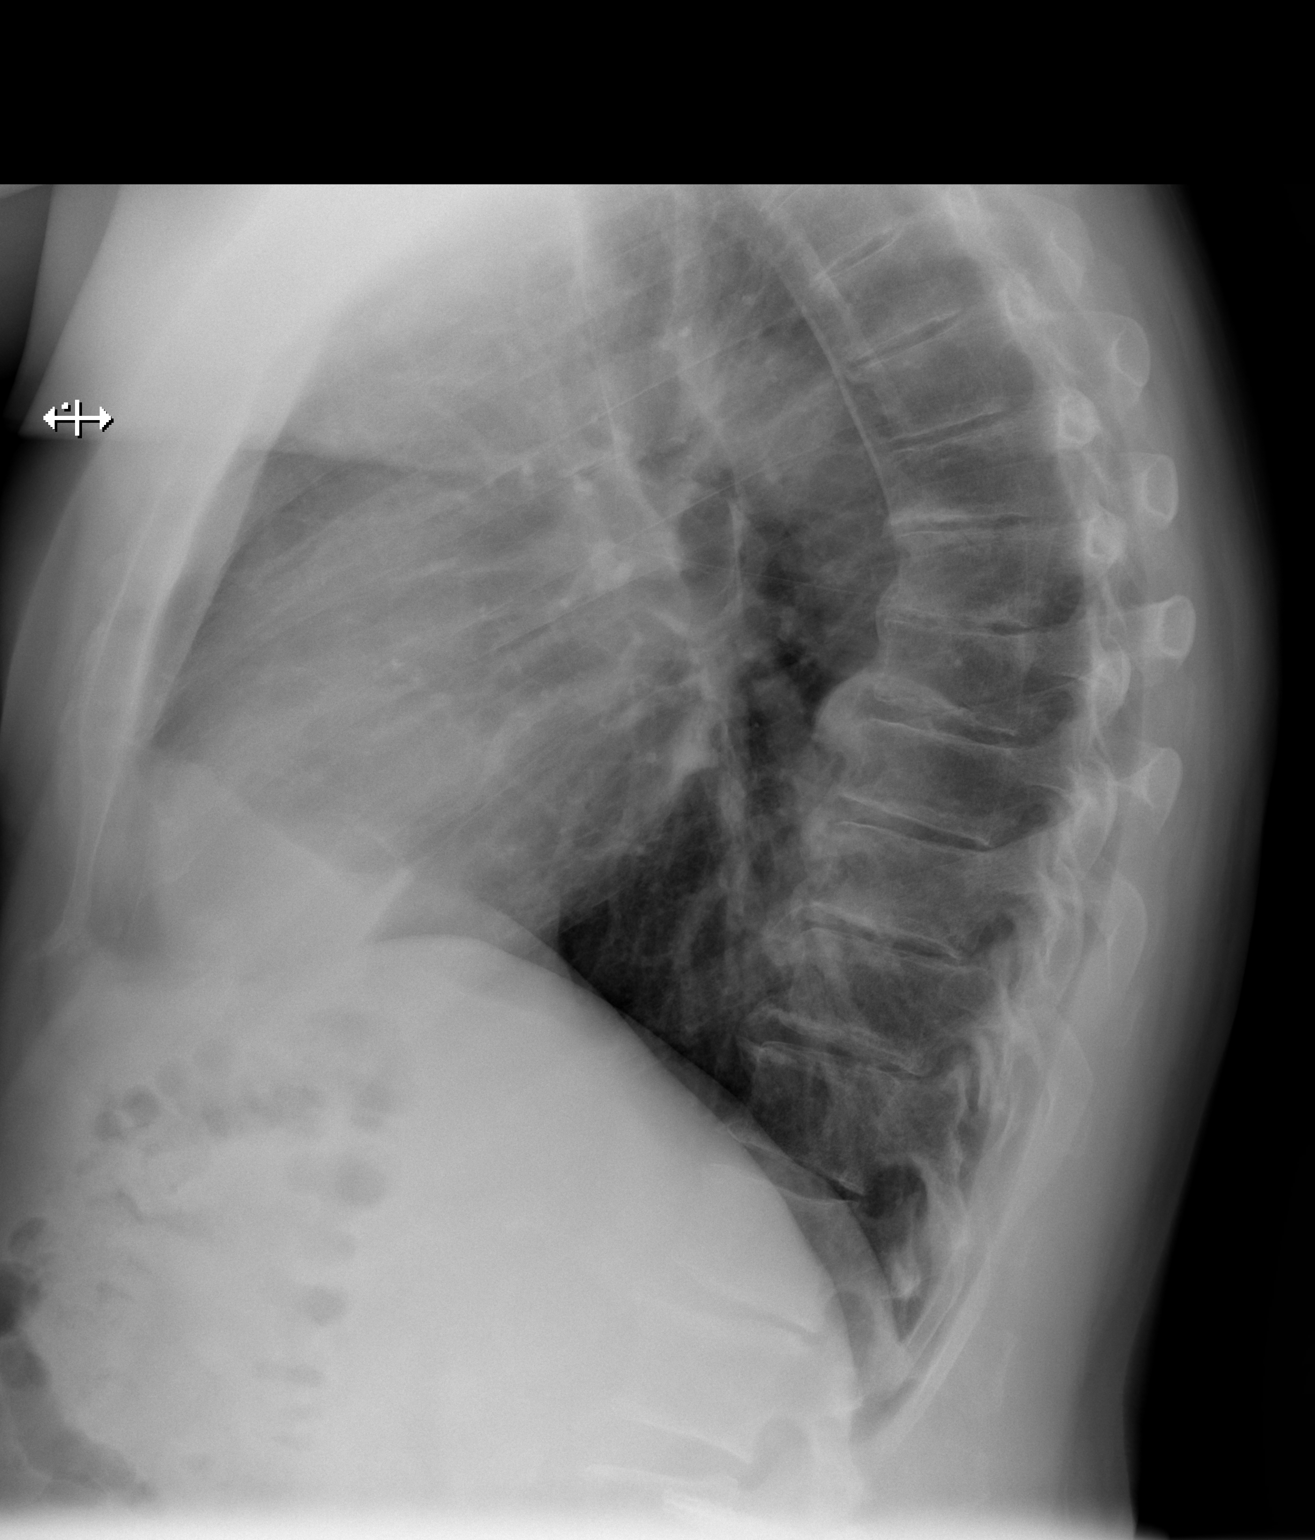

[2 of 2 positions shown; findings below may reference images not displayed]

FINDINGS: Frontal and lateral views of the chest demonstrate an unremarkable
cardiac silhouette. No airspace disease, effusion, or pneumothorax.
No acute bony abnormalities. Moderate midthoracic spondylosis
increased since prior study.
IMPRESSION: 1. No acute intrathoracic process.

## 2023-10-01 DIAGNOSIS — G4700 Insomnia, unspecified: Secondary | ICD-10-CM | POA: Diagnosis not present

## 2023-10-01 DIAGNOSIS — E785 Hyperlipidemia, unspecified: Secondary | ICD-10-CM | POA: Diagnosis not present

## 2023-10-01 DIAGNOSIS — R7303 Prediabetes: Secondary | ICD-10-CM | POA: Diagnosis not present

## 2023-10-01 DIAGNOSIS — K219 Gastro-esophageal reflux disease without esophagitis: Secondary | ICD-10-CM | POA: Diagnosis not present

## 2023-10-01 DIAGNOSIS — I10 Essential (primary) hypertension: Secondary | ICD-10-CM | POA: Diagnosis not present

## 2023-10-01 DIAGNOSIS — Z0000 Encounter for general adult medical examination without abnormal findings: Secondary | ICD-10-CM | POA: Diagnosis not present

## 2023-10-01 DIAGNOSIS — Z23 Encounter for immunization: Secondary | ICD-10-CM | POA: Diagnosis not present

## 2023-10-01 DIAGNOSIS — Z125 Encounter for screening for malignant neoplasm of prostate: Secondary | ICD-10-CM | POA: Diagnosis not present

## 2023-11-18 DIAGNOSIS — M79645 Pain in left finger(s): Secondary | ICD-10-CM | POA: Diagnosis not present

## 2023-11-18 DIAGNOSIS — M65342 Trigger finger, left ring finger: Secondary | ICD-10-CM | POA: Diagnosis not present

## 2023-11-18 DIAGNOSIS — M65332 Trigger finger, left middle finger: Secondary | ICD-10-CM | POA: Diagnosis not present

## 2023-12-17 DIAGNOSIS — H43813 Vitreous degeneration, bilateral: Secondary | ICD-10-CM | POA: Diagnosis not present

## 2024-03-30 DIAGNOSIS — Z23 Encounter for immunization: Secondary | ICD-10-CM | POA: Diagnosis not present

## 2024-03-30 DIAGNOSIS — I498 Other specified cardiac arrhythmias: Secondary | ICD-10-CM | POA: Diagnosis not present

## 2024-03-30 DIAGNOSIS — R079 Chest pain, unspecified: Secondary | ICD-10-CM | POA: Diagnosis not present

## 2024-04-20 NOTE — Progress Notes (Unsigned)
 Cardiology Office Note    Date:  04/21/2024  ID:  Colton Norris, DOB 25-Apr-1951, MRN [id number] PCP:  Pcp, No  Cardiologist:  None - New ([facility])  Chief Complaint: chest pain  History of Present Illness: Colton Norris is a 73 y.o. male with visit-pertinent history of HTN, HLD, GERD, knee arthritis, colon polyps seen for evaluation of chest pain at the request of Rosina Ready, NP. He shares that the reminder of pronunciation of last name is like cockadoodle-doo.  He has no prior cardiac history. On October 7th they had to put their dog down which was a stressful process. On October 8th he spent a lot of time in the yard working on strenuous yardwork. Towards the end he noticed some unusual dyspnea and had to stop and test. He came inside to eat a sandwich and then noticed central chest tightness. This lasted about an hour or so and subsided without intervention, not worse or improved with anything in particular. He went to get his flu shot and asked them to check his BP which was high. He mentioned chest pain episode to them and was advised to see primary care. There, EKG showed NSR with PAC/PVCs. He was advised to go to ED but deferred. Since that time, symptoms have not recurred, including with exertion, though blood pressure remains high. No edema, palpitations or syncope.  Family History: mother - breast CA, father - dementia Tobacco: rare cigars while golfing, no cigarettes Alcohol: 3-4 days per week, 2 drinks per occasion (scotch/wine) Drug use: none  Labwork independently reviewed: Scan 09/2023 LDL 70, trig 163, K 4.3, Cr 0.92, LFTs ok  ROS: .    Please see the history of present illness.  All other systems are reviewed and otherwise negative.  Studies Reviewed: Colton    EKG:  EKG is ordered today, personally reviewed, demonstrating:  EKG Interpretation Date/Time:  Thursday April 21 2024 08:22:17 EDT Ventricular Rate:  72 PR Interval:  188 QRS  Duration:  104 QT Interval:  376 QTC Calculation: 411 R Axis:   -46  Text Interpretation: Sinus rhythm with occasional Premature ventricular complexes Left anterior fascicular block When compared with ECG of 09-Apr-2021 10:49, Premature ventricular complexes are now Present Confirmed by Matilda Fleig [id number]) on 04/21/2024 8:43:22 AM    Reviewed outside 03/30/24: NSR, HR not listed but intervals between 70-100, possible first degree AVB, occasional PACs/PVCs, nonspecific TW changes   CV Studies: Cardiac studies reviewed are outlined and summarized above. Otherwise please see EMR for full report.   Current Reported Medications:.    Current Meds  Medication Sig   aspirin  EC 81 MG tablet Take 81 mg by mouth daily. Swallow whole.   Multiple Vitamins-Minerals (MULTIVITAMIN WITH MINERALS) tablet Take 1 tablet by mouth daily.   olmesartan (BENICAR) 20 MG tablet Take 20 mg by mouth daily.   Omega-3 Fatty Acids (FISH OIL) 1200 MG CAPS Take 2,400 mg by mouth in the morning and at bedtime.   omeprazole (PRILOSEC) 20 MG capsule Take 20 mg by mouth daily.   simvastatin  (ZOCOR ) 40 MG tablet Take 40 mg by mouth daily.   Turmeric 500 MG CAPS Take 500 mg by mouth daily.   vitamin B-12 (CYANOCOBALAMIN) 1000 MCG tablet Take 1,000 mcg by mouth daily.   vitamin C (ASCORBIC ACID ) 500 MG tablet Take 500 mg by mouth daily.   zinc  gluconate 50 MG tablet Take 50 mg by mouth daily.   zolpidem  (AMBIEN ) 10 MG  tablet Take 5 mg by mouth at bedtime as needed for sleep.    Physical Exam:    VS:  BP (!) 140/90   Pulse 72   Ht 6' (1.829 m)   Wt 205 lb (93 kg)   SpO2 98%   BMI 27.80 kg/m    Wt Readings from Last 3 Encounters:  04/21/24 205 lb (93 kg)  04/18/21 200 lb (90.7 kg)  04/09/21 202 lb 8 oz (91.9 kg)    GEN: Well nourished, well developed in no acute distress NECK: No JVD; No carotid bruits CARDIAC: RRR, no murmurs, rubs, gallops RESPIRATORY:  Clear to auscultation without rales, wheezing or rhonchi   ABDOMEN: Soft, non-tender, non-distended EXTREMITIES:  No edema; No acute deformity   Asessement and Plan:.     1. Precordial pain - cardiac risk factors include age, male, HTN, HLD. Etiology of chest pain unclear, isolated to 03/30/24, surrounding emotional stress of death of dog. Did not seek ER care at that time so no troponin values available from event. Will proceed with echocardiogram (looking for structural abnormalities, Takotsubo changes) as well as coronary CTA to evaluate for underlying coronary artery disease. We are starting beta blocker for both his ectopy and BP. I told CMA OK to have patient take usual metoprolol per protocol in place of new carvedilol the day of the scan. Continue ASA in the interim. Will also get basic labs including CBC, CMET, thyroid, Mg.  2. PACs/PVCs - check basic labs including CBC, CMET, thyroid, Mg. Start carvedilol 6.25mg  BID. This was noted on recent PCP EKG as well as EKG here. On auscultation, he had no recurrent ectopy. Will plan workup with echo and coronary CTA to start, with consideration of Zio for quantification once the prioritized testing is arranged so that the Zio does not interfere with imaging.  3. HTN - BP suboptimally controlled, initial value 140/90, recheck by me 150/90. He reports home values are often similar to this, only rare in the 130s. Continue olmesartan but add carvedilol 6.25mg  BID for antianginal, ectopy, and HTN effect.   4. HLD - managed by primary care.     Disposition: F/u with me in 6-8 weeks.  Signed, Raneisha Bress N Emaree Chiu, PA-C

## 2024-04-21 ENCOUNTER — Encounter: Payer: Self-pay | Admitting: Physician Assistant

## 2024-04-21 ENCOUNTER — Ambulatory Visit: Attending: Internal Medicine | Admitting: Physician Assistant

## 2024-04-21 VITALS — BP 150/90 | HR 72 | Ht 72.0 in | Wt 205.0 lb

## 2024-04-21 DIAGNOSIS — R072 Precordial pain: Secondary | ICD-10-CM | POA: Insufficient documentation

## 2024-04-21 DIAGNOSIS — I491 Atrial premature depolarization: Secondary | ICD-10-CM | POA: Diagnosis not present

## 2024-04-21 DIAGNOSIS — I493 Ventricular premature depolarization: Secondary | ICD-10-CM | POA: Diagnosis not present

## 2024-04-21 DIAGNOSIS — E785 Hyperlipidemia, unspecified: Secondary | ICD-10-CM | POA: Insufficient documentation

## 2024-04-21 DIAGNOSIS — I10 Essential (primary) hypertension: Secondary | ICD-10-CM | POA: Insufficient documentation

## 2024-04-21 LAB — CBC

## 2024-04-21 MED ORDER — METOPROLOL TARTRATE 100 MG PO TABS
100.0000 mg | ORAL_TABLET | Freq: Once | ORAL | 0 refills | Status: AC
Start: 2024-04-21 — End: 2024-04-21

## 2024-04-21 MED ORDER — CARVEDILOL 6.25 MG PO TABS: 6.2500 mg | ORAL_TABLET | Freq: Two times a day (BID) | ORAL | 3 refills | Status: DC

## 2024-04-21 NOTE — Patient Instructions (Signed)
 Medication Instructions:  Your physician has recommended you make the following change in your medication:   START Carvedilol 6.25 taking 1 twice a day  *If you need a refill on your cardiac medications before your next appointment, please call your pharmacy*  Lab Work: TODAY:  CMET, MAG, CBC, & TSH RFX TO FREE T4  If you have labs (blood work) drawn today and your tests are completely normal, you will receive your results only by: MyChart Message (if you have MyChart) OR A paper copy in the mail If you have any lab test that is abnormal or we need to change your treatment, we will call you to review the results.  Testing/Procedures: Your physician has requested that you have an echocardiogram. Echocardiography is a painless test that uses sound waves to create images of your heart. It provides your doctor with information about the size and shape of your heart and how well your heart's chambers and valves are working. This procedure takes approximately one hour. There are no restrictions for this procedure. Please do NOT wear cologne, perfume, aftershave, or lotions (deodorant is allowed). Please arrive 15 minutes prior to your appointment time.  Please note: We ask at that you not bring children with you during ultrasound (echo/ vascular) testing. Due to room size and safety concerns, children are not allowed in the ultrasound rooms during exams. Our front office staff cannot provide observation of children in our lobby area while testing is being conducted. An adult accompanying a patient to their appointment will only be allowed in the ultrasound room at the discretion of the ultrasound technician under special circumstances. We apologize for any inconvenience.   Your physician has requested that you have cardiac CT. Cardiac computed tomography (CT) is a painless test that uses an x-ray machine to take clear, detailed pictures of your heart. For further information please visit  [email or web address] Please follow instruction sheet BELOW:    Your cardiac CT will be scheduled at one of the below locations:   [facility] [address] [place], [place] [address] [phone] (Severe contrast allergies only)  OR   [facility] [address] [place], [place] [address] [phone]  OR   [facility] [address] [place], [place] [address] [phone]  OR   [facility] and V[facility] Tower [address]  [place], [place] [address]  OR   [facility] [address] [place], [place] [phone]  If scheduled at [facility], please arrive at the [facility] and Children's Entrance (Entrance C2) of [facility] 30 minutes prior to test start time. You can use the FREE valet parking offered at entrance C (encouraged to control the heart rate for the test)  Proceed to the [facility] Radiology Department (first floor) to check-in and test prep.  All radiology patients and guests should use entrance C2 at [facility], accessed from [facility], even though the hospital's physical address listed is [address].  If scheduled at the Heart and Vascular Tower at [address], please enter the parking lot using the [address] entrance and use the FREE valet service at the patient drop-off area. Enter the building and check-in with registration on the main floor.  If scheduled at [facility], please arrive to the Heart and Vascular Center 15 mins early for check-in and test prep.  There is spacious parking and easy access to the radiology department  from the [facility] Heart and Vascular entrance. Please enter here and check-in with the desk attendant.   If scheduled at [facility], please arrive 30 minutes early for check-in and test prep.  Please follow these instructions carefully (unless otherwise directed):  An  IV will be required for this test and Nitroglycerin will be given.  Hold all erectile dysfunction medications at least 3 days (72 hrs) prior to test. (Ie viagra, cialis, sildenafil, tadalafil, etc)   On the Night Before the Test: Be sure to Drink plenty of water. Do not consume any caffeinated/decaffeinated beverages or chocolate 12 hours prior to your test. Do not take any antihistamines 12 hours prior to your test.   On the Day of the Test: Drink plenty of water until 1 hour prior to the test. Do not eat any food 1 hour prior to test. You may take your regular medications prior to the test.  HOLD THE CARVEDILOL ON THE DAY OF YOUR CARDIAC CT.  Take metoprolol (Lopressor) 100 MG two hours prior to test.  [facility]    After the Test: Drink plenty of water. After receiving IV contrast, you may experience a mild flushed feeling. This is normal. On occasion, you may experience a mild rash up to 24 hours after the test. This is not dangerous. If this occurs, you can take Benadryl  25 mg, Zyrtec, Claritin, or Allegra and increase your fluid intake. (Patients taking Tikosyn should avoid Benadryl , and may take Zyrtec, Claritin, or Allegra) If you experience trouble breathing, this can be serious. If it is severe call 911 IMMEDIATELY. If it is mild, please call our office.  We will call to schedule your test 2-4 weeks out understanding that some insurance companies will need an authorization prior to the service being performed.   For more information and frequently asked questions, please visit our website : [email or web address]  For non-scheduling related questions, please contact the cardiac imaging nurse navigator should you have any questions/concerns: Cardiac Imaging Nurse Navigators Direct Office Dial: [phone]   For scheduling needs, including cancellations and rescheduling, please call Brittany, [phone].   Follow-Up: At [facility], you  and your health needs are our priority.  As part of our continuing mission to provide you with exceptional heart care, our providers are all part of one team.  This team includes your primary Cardiologist (physician) and Advanced Practice Providers or APPs (Physician Assistants and Nurse Practitioners) who all work together to provide you with the care you need, when you need it.  Your next appointment:   6-8  week(s)  Provider:   Raphael Bring, PA-C          We recommend signing up for the patient portal called MyChart.  Sign up information is provided on this After Visit Summary.  MyChart is used to connect with patients for Virtual Visits (Telemedicine).  Patients are able to view lab/test results, encounter notes, upcoming appointments, etc.  Non-urgent messages can be sent to your provider as well.   To learn more about what you can do with MyChart, go to [email or web address].   Other Instructions

## 2024-04-22 ENCOUNTER — Ambulatory Visit: Payer: Self-pay | Admitting: Physician Assistant

## 2024-04-22 DIAGNOSIS — R918 Other nonspecific abnormal finding of lung field: Secondary | ICD-10-CM

## 2024-04-22 LAB — MAGNESIUM: Magnesium: 2.2 mg/dL (ref 1.6–2.3)

## 2024-04-22 LAB — CBC
Hematocrit: 46.5 % (ref 37.5–51.0)
Hemoglobin: 15.1 g/dL (ref 13.0–17.7)
MCH: 31.7 pg (ref 26.6–33.0)
MCHC: 32.5 g/dL (ref 31.5–35.7)
MCV: 98 fL — ABNORMAL HIGH (ref 79–97)
Platelets: 316 x10E3/uL (ref 150–450)
RBC: 4.77 x10E6/uL (ref 4.14–5.80)
RDW: 11.8 % (ref 11.6–15.4)
WBC: 9.5 x10E3/uL (ref 3.4–10.8)

## 2024-04-22 LAB — TSH RFX ON ABNORMAL TO FREE T4: TSH: 1.44 u[IU]/mL (ref 0.450–4.500)

## 2024-04-22 LAB — COMPREHENSIVE METABOLIC PANEL WITH GFR
ALT: 27 IU/L (ref 0–44)
AST: 28 IU/L (ref 0–40)
Albumin: 4.6 g/dL (ref 3.8–4.8)
Alkaline Phosphatase: 87 IU/L (ref 47–123)
BUN/Creatinine Ratio: 14 (ref 10–24)
BUN: 14 mg/dL (ref 8–27)
Bilirubin Total: 0.7 mg/dL (ref 0.0–1.2)
CO2: 20 mmol/L (ref 20–29)
Calcium: 10 mg/dL (ref 8.6–10.2)
Chloride: 100 mmol/L (ref 96–106)
Creatinine, Ser: 0.98 mg/dL (ref 0.76–1.27)
Globulin, Total: 2.9 g/dL (ref 1.5–4.5)
Glucose: 96 mg/dL (ref 70–99)
Potassium: 4.4 mmol/L (ref 3.5–5.2)
Sodium: 138 mmol/L (ref 134–144)
Total Protein: 7.5 g/dL (ref 6.0–8.5)
eGFR: 82 mL/min/1.73 (ref >59)

## 2024-04-27 ENCOUNTER — Ambulatory Visit (HOSPITAL_COMMUNITY)
Admission: RE | Admit: 2024-04-27 | Discharge: 2024-04-27 | Disposition: A | Discharge status: Home / Self Care | Source: Ambulatory Visit | Attending: Cardiology | Admitting: Cardiology

## 2024-04-27 DIAGNOSIS — I10 Essential (primary) hypertension: Secondary | ICD-10-CM | POA: Insufficient documentation

## 2024-04-27 DIAGNOSIS — I493 Ventricular premature depolarization: Secondary | ICD-10-CM | POA: Diagnosis not present

## 2024-04-27 DIAGNOSIS — E785 Hyperlipidemia, unspecified: Secondary | ICD-10-CM | POA: Insufficient documentation

## 2024-04-27 DIAGNOSIS — I491 Atrial premature depolarization: Secondary | ICD-10-CM | POA: Diagnosis not present

## 2024-04-27 DIAGNOSIS — R072 Precordial pain: Secondary | ICD-10-CM | POA: Diagnosis not present

## 2024-04-27 LAB — ECHOCARDIOGRAM COMPLETE
Area-P 1/2: 4.21 cm2
S' Lateral: 3.7 cm

## 2024-04-28 ENCOUNTER — Encounter (HOSPITAL_COMMUNITY): Payer: Self-pay

## 2024-04-29 ENCOUNTER — Ambulatory Visit (HOSPITAL_COMMUNITY)
Admission: RE | Admit: 2024-04-29 | Discharge: 2024-04-29 | Disposition: A | Discharge status: Home / Self Care | Source: Ambulatory Visit | Attending: Internal Medicine | Admitting: Internal Medicine

## 2024-04-29 DIAGNOSIS — R072 Precordial pain: Secondary | ICD-10-CM | POA: Insufficient documentation

## 2024-04-29 DIAGNOSIS — I2510 Atherosclerotic heart disease of native coronary artery without angina pectoris: Secondary | ICD-10-CM | POA: Diagnosis not present

## 2024-04-29 DIAGNOSIS — I491 Atrial premature depolarization: Secondary | ICD-10-CM | POA: Insufficient documentation

## 2024-04-29 MED ORDER — NITROGLYCERIN 0.4 MG SL SUBL
0.8000 mg | SUBLINGUAL_TABLET | Freq: Once | SUBLINGUAL | Status: AC
  Administered 2024-04-29: 0.8 mg via SUBLINGUAL

## 2024-04-29 MED ORDER — IOHEXOL 350 MG/ML SOLN
95.0000 mL | Freq: Once | INTRAVENOUS | Status: AC | PRN
  Administered 2024-04-29: 95 mL via INTRAVENOUS

## 2024-05-01 ENCOUNTER — Ambulatory Visit (HOSPITAL_COMMUNITY)
Admission: RE | Admit: 2024-05-01 | Discharge: 2024-05-01 | Disposition: A | Discharge status: Home / Self Care | Payer: Self-pay | Source: Ambulatory Visit | Attending: Cardiology | Admitting: Cardiology

## 2024-05-01 ENCOUNTER — Other Ambulatory Visit: Payer: Self-pay | Admitting: Cardiology

## 2024-05-01 DIAGNOSIS — R931 Abnormal findings on diagnostic imaging of heart and coronary circulation: Secondary | ICD-10-CM

## 2024-05-02 ENCOUNTER — Ambulatory Visit: Payer: Self-pay | Admitting: Physician Assistant

## 2024-05-02 DIAGNOSIS — E785 Hyperlipidemia, unspecified: Secondary | ICD-10-CM

## 2024-05-02 DIAGNOSIS — I493 Ventricular premature depolarization: Secondary | ICD-10-CM

## 2024-05-03 ENCOUNTER — Ambulatory Visit: Attending: Physician Assistant

## 2024-05-03 DIAGNOSIS — I493 Ventricular premature depolarization: Secondary | ICD-10-CM

## 2024-05-03 MED ORDER — ROSUVASTATIN CALCIUM 40 MG PO TABS: 40.0000 mg | ORAL_TABLET | Freq: Every day | ORAL | 3 refills | Status: AC

## 2024-05-03 NOTE — Progress Notes (Signed)
 Orders placed for 3 day zio, flp, and lft's. Labs future dated for 2 month collect. Simvastatin  taken off pt med list.

## 2024-05-03 NOTE — Progress Notes (Unsigned)
 Enrolled patient for a 3 day Zio XT monitor to be mailed to patients home

## 2024-05-17 DIAGNOSIS — I493 Ventricular premature depolarization: Secondary | ICD-10-CM | POA: Diagnosis not present

## 2024-05-22 DIAGNOSIS — I493 Ventricular premature depolarization: Secondary | ICD-10-CM

## 2024-05-23 ENCOUNTER — Ambulatory Visit: Payer: Self-pay | Admitting: Physician Assistant

## 2024-06-06 NOTE — Progress Notes (Unsigned)
 Cardiology Office Note    Date:  06/09/2024  ID:  [name], DOB [birth year], MRN [id number] PCP:  Pcp, No  Cardiologist:  [facility] - Raphael Bring PA-C Electrophysiologist:  None   Chief Complaint: f/u cardiac testing - Zio, echo, cor CTA  History of Present Illness: Colton Norris    Colton Norris (like cockadoodle-doo per patient) is a 73 y.o. male with visit-pertinent history of HTN, HLD, GERD, knee arthritis, colon polyps, CAD by coronary CT, mild dilation of aorta, 6mm pulmonary nodule by CT 04/2024. PACs, PVCs.  He was recently seen in [facility] clinic 03/2024 for episode of chest pain, see full OV for details. He had seen PCP with EKG showing NSR with PACs/PVCs. At our OV, he was hypertensive so started on carvedilol . 2D echo showed EF 55-60%, mildly enlarged RV with normal PA pressure, mild dilation of aortic root/ascending aorta. Cor CTA showed CAC 2726/95%ile with  50-69% ostial/prox LAD/RCA; 25-49% mid/distal LAD, prox/mid LCx, prox D1, mid/distal RCA and prox/mid RPLA - FFR showed possible hemodynamically significant flow limitation in the mid LAD (but Delta is <0.12) and may represent distal tapering of LAD or microvascular disease, recommended for aggressive anti-anginal/preventative therapy with consideration of catheterization for persistent chest pain on anti-anginal therapy. Simvastatin  was changed to rosuvastatin . Overread also showed 6mm LLL pulmonary nodule. Zio showed NSR, range 51-119bpm, avg 76bpm, 1 4bt PSVT, 6.2% frequent PACs, rare PVCs.  He returns for follow-up today feeling well without any recurrent chest pain. No SOB, palpitations, orthopnea, PND, or any concerning cardiac symptoms. He remains physically active and plans a round of golf this AM if the rain stays away. Discussed BP log (SBP frequently 130s-150s at home), lifestyle modifications, and testing results.  Labwork independently reviewed: 03/2024 TSH Wnl, Hgb 15.1, plt 316, Mg 2.2, K 4.4, Cr 0.98, LFTs  ok Scan 09/2023 LDL 70, trig 163, K 4.3, Cr 0.92, LFTs ok   ROS: .    Please see the history of present illness. All other systems are reviewed and otherwise negative.  Studies Reviewed: Colton Norris    EKG:  EKG is not ordered today  CV Studies: Cardiac studies reviewed are outlined and summarized above. Otherwise please see EMR for full report.   Current Reported Medications:.    Prior to Admission medications  Medication Sig Start Date End Date Taking? Authorizing Provider  aspirin  EC 81 MG tablet Take 81 mg by mouth daily. Swallow whole.   Yes [provider]  carvedilol  (COREG ) 6.25 MG tablet Take 1 tablet (6.25 mg total) by mouth 2 (two) times daily. 04/21/24  Yes Jamiaya Bina N, PA-C  Multiple Vitamins-Minerals (MULTIVITAMIN WITH MINERALS) tablet Take 1 tablet by mouth daily.   Yes [provider]  olmesartan (BENICAR) 20 MG tablet Take 20 mg by mouth daily.   Yes [provider]  Omega-3 Fatty Acids (FISH OIL) 1200 MG CAPS Take 2,400 mg by mouth in the morning and at bedtime.   Yes [provider]  omeprazole (PRILOSEC) 20 MG capsule Take 20 mg by mouth daily.   Yes [provider]  rosuvastatin  (CRESTOR ) 40 MG tablet Take 1 tablet (40 mg total) by mouth daily. 05/03/24 08/01/24 Yes Frandy Basnett N, PA-C  Turmeric 500 MG CAPS Take 500 mg by mouth daily.   Yes [provider]  vitamin B-12 (CYANOCOBALAMIN) 1000 MCG tablet Take 1,000 mcg by mouth daily.   Yes [provider]  vitamin C (ASCORBIC ACID ) 500 MG tablet Take 500 mg by  mouth daily.   Yes [provider]  zinc  gluconate 50 MG tablet Take 50 mg by mouth daily.   Yes [provider]  zolpidem  (AMBIEN ) 10 MG tablet Take 5 mg by mouth at bedtime as needed for sleep.   Yes [provider]     Physical Exam:    VS:  BP 134/70 (BP Location: Right Arm, Patient Position: Sitting, Cuff Size: Normal)   Pulse 70   Ht 5' 11 (1.803 m)   Wt 209 lb (94.8  kg)   BMI 29.15 kg/m    Wt Readings from Last 3 Encounters:  06/09/24 209 lb (94.8 kg)  04/21/24 205 lb (93 kg)  04/18/21 200 lb (90.7 kg)    GEN: Well nourished, well developed in no acute distress NECK: No JVD; No carotid bruits CARDIAC: RRR, rare ectopy, no murmurs, rubs, gallops RESPIRATORY:  Clear to auscultation without rales, wheezing or rhonchi  ABDOMEN: Soft, non-tender, non-distended EXTREMITIES:  No edema; No acute deformity   Asessement and Plan:.    1. CAD, HLD - discussed coronary CTA findings with DOD Dr. Wendel, recommended to continue medical therapy unless recurrent/refractory anginal symptoms. He has not had any recurrent chest pain or concerning symptoms therefore continue medical therapy. Titrating beta blocker as below for BP. Continue ASA 81mg  daily, rosuvastatin  40mg  daily with follow-up fasting lipid panel and LFTs in mid January.   2. Essential HTN - suboptimally controlled by home readings 130s-150s. Increase carvedilol  to 12.5mg  BID with BP/HR log reported back in 1-2 weeks. I thanked him for diligently following this at home. Continue olmesartan 20mg  daily. Also discussed lifestyle modifications.  3. PACs, PVCs - quiescent by symptoms, only rare ectopy on exam. Follow clinically with titration of beta blocker. LVEF wnl.  4. Mild dilation of aorta - anticipate repeat echo 04/2025, can be arranged closer to that time, unless non-coronary CT in 10/2024 comments on the aortic dimension. BP control planned as above.  5. Mild right ventricular enlargement - discussed sleep study with patient. He only snores rarely, but does have general difficulty falling asleep. He wishes to defer sleep study at this time.  6. Pulmonary nodule - noted on cor CTA incidentally, discussed with patient. F/u non-contrasted CT scheduled for 10/2024.      Disposition: F/u with Dr. Floretta to establish care in 4 months given [facility] patient.  Signed, Aharon Carriere N Elmor Kost, PA-C

## 2024-06-09 ENCOUNTER — Ambulatory Visit: Attending: Physician Assistant | Admitting: Physician Assistant

## 2024-06-09 ENCOUNTER — Encounter: Payer: Self-pay | Admitting: Physician Assistant

## 2024-06-09 VITALS — BP 134/70 | HR 70 | Ht 71.0 in | Wt 209.0 lb

## 2024-06-09 DIAGNOSIS — I2510 Atherosclerotic heart disease of native coronary artery without angina pectoris: Secondary | ICD-10-CM | POA: Diagnosis present

## 2024-06-09 DIAGNOSIS — E785 Hyperlipidemia, unspecified: Secondary | ICD-10-CM | POA: Insufficient documentation

## 2024-06-09 DIAGNOSIS — R911 Solitary pulmonary nodule: Secondary | ICD-10-CM | POA: Diagnosis present

## 2024-06-09 DIAGNOSIS — I491 Atrial premature depolarization: Secondary | ICD-10-CM | POA: Insufficient documentation

## 2024-06-09 DIAGNOSIS — I77819 Aortic ectasia, unspecified site: Secondary | ICD-10-CM | POA: Insufficient documentation

## 2024-06-09 DIAGNOSIS — I493 Ventricular premature depolarization: Secondary | ICD-10-CM | POA: Diagnosis present

## 2024-06-09 DIAGNOSIS — I10 Essential (primary) hypertension: Secondary | ICD-10-CM | POA: Diagnosis present

## 2024-06-09 MED ORDER — CARVEDILOL 12.5 MG PO TABS: 12.5000 mg | ORAL_TABLET | Freq: Two times a day (BID) | ORAL | 1 refills | Status: AC

## 2024-06-09 NOTE — Patient Instructions (Signed)
 Medication Instructions:  INCREASE CARVEDILOL  TO 12.5 MG TWICE DAILY. (MAY TAKE TWO 6.25 MG TABLETS TWICE DAILY UNTIL GONE!)  Lab Work: FASTING LIPID PANEL AND LFTs TO BE DONE IN MID JANUARY.  Testing/Procedures: NONE  Follow-Up: At [facility], you and your health needs are our priority.  As part of our continuing mission to provide you with exceptional heart care, our providers are all part of one team.  This team includes your primary Cardiologist (physician) and Advanced Practice Providers or APPs (Physician Assistants and Nurse Practitioners) who all work together to provide you with the care you need, when you need it.  Your next appointment:   4 MONTHS TO ESTABLISH CARE WITH DR. GEORGANNA ARCHER  Provider:   Georganna Archer, MD  Other Instructions: Please check your blood pressure daily for two weeks, then contact the office with your readings  Please contact the office with your readings either by phone, by dropping it off in person, or by sending it through MyChart.   Be sure to check your blood pressure one to two hours after taking your medications.  Avoid the following for 30 minutes before checking your blood pressure: No caffeine No alcohol No eating No smoking  No exercise  Five minutes before checking your blood pressure: Use the restroom Sit up straight in a chair with your back supported and feet flat on the floor Remain quiet and do not talk

## 2024-07-12 ENCOUNTER — Telehealth: Payer: Self-pay | Admitting: Physician Assistant

## 2024-07-12 NOTE — Telephone Encounter (Signed)
 Paper BP log reviewed. I scanned a copy under Media from [place]. Half of the readings are slightly higher than goal, half of the readings are at goal.  Would continue with striving towards lifestyle modifications since these can have a big impact on blood pressure, including working on reducing sodium and alcohol in the diet, regular exercise/walking, and good sleep hygiene.  If he thinks there is room for improvement on these things, I'd have him work on this then submit BP readings in 2 weeks to see how things are looking. But if he feels he is already doing the best he is able to, would increase carvedilol  to 1.5 tablets twice a day (18.75mg  BID) and send in BP/HR readings 2 weeks after this change, notifying for any issues with low BP or HR. Thank you!

## 2024-07-12 NOTE — Telephone Encounter (Signed)
 Spoke with patient and shared message from Dayna, patient verbalized understanding and states he will continue with lifestyle modifications and follow-up with Dr. Floretta in March. He will notify us  if any concerns prior to that appt. Patient expressed appreciation for follow-up.

## 2024-07-12 NOTE — Telephone Encounter (Signed)
 Patient dropped of blood pressure chart In provider box

## 2024-07-13 ENCOUNTER — Ambulatory Visit: Payer: Self-pay | Admitting: Physician Assistant

## 2024-07-13 LAB — LIPID PANEL
Chol/HDL Ratio: 2.8 ratio (ref 0.0–5.0)
Cholesterol, Total: 124 mg/dL (ref 100–199)
HDL: 45 mg/dL
LDL Chol Calc (NIH): 57 mg/dL (ref 0–99)
Triglycerides: 125 mg/dL (ref 0–149)
VLDL Cholesterol Cal: 22 mg/dL (ref 5–40)

## 2024-07-13 LAB — HEPATIC FUNCTION PANEL
ALT: 31 IU/L (ref 0–44)
AST: 26 IU/L (ref 0–40)
Albumin: 4.2 g/dL (ref 3.8–4.8)
Alkaline Phosphatase: 82 IU/L (ref 47–123)
Bilirubin Total: 0.6 mg/dL (ref 0.0–1.2)
Bilirubin, Direct: 0.22 mg/dL (ref 0.00–0.40)
Total Protein: 6.9 g/dL (ref 6.0–8.5)

## 2024-09-16 ENCOUNTER — Ambulatory Visit: Admitting: Student in an Organized Health Care Education/Training Program

## 2024-10-24 ENCOUNTER — Other Ambulatory Visit (HOSPITAL_COMMUNITY)
